# Patient Record
Sex: Female | Born: 1945
Health system: Southern US, Community
[De-identification: ages and names within clinical notes are randomized; demographics above are authoritative.]

## PROBLEM LIST (undated history)

## (undated) DIAGNOSIS — H04129 Dry eye syndrome of unspecified lacrimal gland: Secondary | ICD-10-CM

## (undated) DIAGNOSIS — M810 Age-related osteoporosis without current pathological fracture: Secondary | ICD-10-CM

## (undated) DIAGNOSIS — M1990 Unspecified osteoarthritis, unspecified site: Secondary | ICD-10-CM

## (undated) DIAGNOSIS — K5790 Diverticulosis of intestine, part unspecified, without perforation or abscess without bleeding: Secondary | ICD-10-CM

## (undated) DIAGNOSIS — J309 Allergic rhinitis, unspecified: Secondary | ICD-10-CM

## (undated) DIAGNOSIS — J324 Chronic pansinusitis: Secondary | ICD-10-CM

## (undated) DIAGNOSIS — G4730 Sleep apnea, unspecified: Secondary | ICD-10-CM

## (undated) HISTORY — DX: Sleep apnea, unspecified: G47.30

## (undated) HISTORY — DX: Diverticulosis of intestine, part unspecified, without perforation or abscess without bleeding: K57.90

## (undated) HISTORY — PX: WISDOM TOOTH EXTRACTION: SHX21

## (undated) HISTORY — DX: Allergic rhinitis, unspecified: J30.9

## (undated) HISTORY — PX: TONSILLECTOMY AND ADENOIDECTOMY: SUR1326

## (undated) HISTORY — DX: Age-related osteoporosis without current pathological fracture: M81.0

---

## 1983-10-21 HISTORY — PX: OTHER SURGICAL HISTORY: SHX169

## 1986-10-20 HISTORY — PX: TUBAL LIGATION: SHX77

## 2004-09-27 ENCOUNTER — Ambulatory Visit: Payer: Self-pay

## 2006-06-13 ENCOUNTER — Emergency Department: Payer: Self-pay | Admitting: Emergency Medicine

## 2006-06-13 ENCOUNTER — Other Ambulatory Visit: Payer: Self-pay

## 2011-02-13 HISTORY — PX: COLONOSCOPY: SHX174

## 2011-03-13 ENCOUNTER — Ambulatory Visit: Payer: Self-pay | Admitting: Gastroenterology

## 2012-07-05 ENCOUNTER — Ambulatory Visit: Payer: Self-pay | Admitting: Surgery

## 2012-07-05 HISTORY — PX: HERNIA REPAIR: SHX51

## 2012-07-24 ENCOUNTER — Emergency Department: Payer: Self-pay | Admitting: Emergency Medicine

## 2014-10-20 DIAGNOSIS — M810 Age-related osteoporosis without current pathological fracture: Secondary | ICD-10-CM

## 2014-10-20 HISTORY — DX: Age-related osteoporosis without current pathological fracture: M81.0

## 2015-02-06 NOTE — Op Note (Signed)
PATIENT NAME:  [name], Lynn L MR#:  [id number] DATE OF BIRTH:  October 18, 1946  DATE OF PROCEDURE:  07/05/2012  PREOPERATIVE DIAGNOSIS: Right inguinal hernia.   POSTOPERATIVE DIAGNOSIS: Right inguinal hernia.   PROCEDURE: Right inguinal hernia repair.   SURGEON: Adella HareJ. Wilton Burnsworth, MD  ANESTHESIA: General.   INDICATIONS: This 69 year old female has a history of right groin pain and physical findings of right inguinal hernia. Repair was recommended for definitive treatment.   DESCRIPTION OF PROCEDURE: The patient was placed on the operating table in the supine position under general anesthesia. The abdomen was clipped and prepared with ChloraPrep, draped in a sterile manner.   A transversely oriented right suprapubic incision was made some 4 cm in length, carried down through subcutaneous tissues. Several small bleeding points were cauterized. The Scarpa's fascia was incised. The external oblique aponeurosis was exposed. The external ring was identified. An incision was made in the external oblique aponeurosis along the course of its fibers to open the external ring and expose an indirect hernia sac which was dissected free from surrounding structures. It was approximately 4 cm in length and was separated from the ilioinguinal nerve. A high ligation of the sac was done with 4-0 Vicryl suture ligature. The sac was excised, did not need to be sent for pathology. The stump was allowed to retract. The repair was carried out with a row of 0 Surgilon sutures beginning at the pubic tubercle, suturing the conjoined tendon to the shelving edge of the inguinal ligament and the last stitch led to satisfactory closure of the internal ring. The ilioinguinal nerve was replaced along the floor of the inguinal canal. Cut edges of the external oblique aponeurosis were closed with running 4-0 Vicryl. The fascia superior and lateral to repair site was infiltrated with 10 mL of 0.5% Sensorcaine with epinephrine. Subcutaneous  tissues were infiltrated with 5 mL. Next Scarpa's fascia was closed with interrupted 4-0 Vicryl. The skin was closed with a running 4-0 Monocryl subcuticular suture and Dermabond.   The patient tolerated surgery satisfactorily and was then prepared for transfer to the recovery room.  ____________________________ Shela CommonsJ. Renda RollsWilton Jaspers, MD jws:cms D: 07/05/2012 08:28:07 ET T: 07/05/2012 08:50:59 ET JOB#: [id number]  cc: Adella HareJ. Wilton Wildrick, MD, <Dictator> Adella HareWILTON J Burnley MD ELECTRONICALLY SIGNED 07/08/2012 18:23

## 2017-06-23 ENCOUNTER — Emergency Department: Payer: Medicare Other

## 2017-06-23 ENCOUNTER — Emergency Department
Admission: EM | Admit: 2017-06-23 | Discharge: 2017-06-23 | Disposition: A | Discharge status: Home / Self Care | Payer: Medicare Other | Attending: Emergency Medicine | Admitting: Emergency Medicine

## 2017-06-23 ENCOUNTER — Encounter: Payer: Self-pay | Admitting: *Deleted

## 2017-06-23 DIAGNOSIS — Z7722 Contact with and (suspected) exposure to environmental tobacco smoke (acute) (chronic): Secondary | ICD-10-CM | POA: Diagnosis not present

## 2017-06-23 DIAGNOSIS — R55 Syncope and collapse: Secondary | ICD-10-CM | POA: Insufficient documentation

## 2017-06-23 DIAGNOSIS — X58XXXA Exposure to other specified factors, initial encounter: Secondary | ICD-10-CM | POA: Insufficient documentation

## 2017-06-23 DIAGNOSIS — Y929 Unspecified place or not applicable: Secondary | ICD-10-CM | POA: Insufficient documentation

## 2017-06-23 DIAGNOSIS — S0990XA Unspecified injury of head, initial encounter: Secondary | ICD-10-CM | POA: Insufficient documentation

## 2017-06-23 DIAGNOSIS — Y939 Activity, unspecified: Secondary | ICD-10-CM | POA: Insufficient documentation

## 2017-06-23 DIAGNOSIS — Y999 Unspecified external cause status: Secondary | ICD-10-CM | POA: Diagnosis not present

## 2017-06-23 HISTORY — DX: Dry eye syndrome of unspecified lacrimal gland: H04.129

## 2017-06-23 LAB — CBC WITH DIFFERENTIAL/PLATELET
BASOS ABS: 0 10*3/uL (ref 0–0.1)
BASOS PCT: 1 %
EOS ABS: 0.1 10*3/uL (ref 0–0.7)
EOS PCT: 1 %
HEMATOCRIT: 33.6 % — AB (ref 35.0–47.0)
Hemoglobin: 11.7 g/dL — ABNORMAL LOW (ref 12.0–16.0)
Lymphocytes Relative: 20 %
Lymphs Abs: 1.1 10*3/uL (ref 1.0–3.6)
MCH: 32.2 pg (ref 26.0–34.0)
MCHC: 34.7 g/dL (ref 32.0–36.0)
MCV: 92.7 fL (ref 80.0–100.0)
MONO ABS: 0.2 10*3/uL (ref 0.2–0.9)
MONOS PCT: 4 %
NEUTROS ABS: 4 10*3/uL (ref 1.4–6.5)
Neutrophils Relative %: 74 %
PLATELETS: 187 10*3/uL (ref 150–440)
RBC: 3.62 MIL/uL — ABNORMAL LOW (ref 3.80–5.20)
RDW: 14.8 % — AB (ref 11.5–14.5)
WBC: 5.4 10*3/uL (ref 3.6–11.0)

## 2017-06-23 LAB — BASIC METABOLIC PANEL
Anion gap: 7 (ref 5–15)
BUN: 20 mg/dL (ref 6–20)
CALCIUM: 9.3 mg/dL (ref 8.9–10.3)
CO2: 27 mmol/L (ref 22–32)
CREATININE: 0.71 mg/dL (ref 0.44–1.00)
Chloride: 107 mmol/L (ref 101–111)
Glucose, Bld: 138 mg/dL — ABNORMAL HIGH (ref 65–99)
Potassium: 3.5 mmol/L (ref 3.5–5.1)
Sodium: 141 mmol/L (ref 135–145)

## 2017-06-23 LAB — TROPONIN I: Troponin I: <0.03 ng/mL (ref <0.03)

## 2017-06-23 MED ORDER — SODIUM CHLORIDE 0.9 % IV BOLUS (SEPSIS)
1000.0000 mL | Freq: Once | INTRAVENOUS | Status: AC
  Administered 2017-06-23: 1000 mL via INTRAVENOUS

## 2017-06-23 NOTE — ED Provider Notes (Signed)
[facility] Emergency Department Provider Note  ____________________________________________   First MD Initiated Contact with Patient 06/23/17 2120     (approximate)  I have reviewed the triage vital signs and the nursing notes.   HISTORY  Chief Complaint Loss of Consciousness   HPI Lynn Gomez is a 71 y.o. female without any chronic medical conditions was presenting to the emergency department after an episode of syncope. Says that she was coming home from a meeting when she felt something bite her right index finger. Says it felt like a "yellow jacket." She says that she then went to the bathroom, urinated and then started to feel that her "ears were hot." She felt her heart beating quickly and then passed out. She hit her left eyebrow while falling. Only brief episode of unconsciousness without any witnessed convulsing, loss of bowel or bladder continence. Patient says that now she feels a pressure in her abdomen like she needs to move her bowels. Says that she has no documented allergy to bug bites or bee stings and that she has had mild swelling before with bites.She denies any difficulty breathing or feeling that her tongue swelling.   Past Medical History:  Diagnosis Date  . Dry eye syndrome     There are no active problems to display for this patient.   Past Surgical History:  Procedure Laterality Date  . HERNIA REPAIR Right   . TONSILLECTOMY    . TUBAL LIGATION      Prior to Admission medications   Not on File    Allergies Patient has no known allergies.  No family history on file.  Social History Social History  Substance Use Topics  . Smoking status: Passive Smoke Exposure - Never Smoker  . Smokeless tobacco: Never Used  . Alcohol use No    Review of Systems  Constitutional: No fever/chills Eyes: No visual changes. ENT: No sore throat. Cardiovascular: Denies chest pain. Respiratory: Denies shortness of  breath. Gastrointestinal: No abdominal pain.  No nausea, no vomiting.  No diarrhea.  No constipation. Genitourinary: Negative for dysuria. Musculoskeletal: Negative for back pain. Skin: Negative for rash. Neurological: Negative for headaches, focal weakness or numbness.   ____________________________________________   PHYSICAL EXAM:  VITAL SIGNS: ED Triage Vitals  Enc Vitals Group     BP      Pulse      Resp      Temp      Temp src      SpO2      Weight      Height      Head Circumference      Peak Flow      Pain Score      Pain Loc      Pain Edu?      Excl. in GC?     Constitutional: Alert and oriented. Well appearing and in no acute distress. Eyes: Conjunctivae are normal.  Head: Atraumatic. Nose: No congestion/rhinnorhea. Mouth/Throat: Mucous membranes are moist.  Neck: No stridor.   Cardiovascular: Normal rate, regular rhythm. Grossly normal heart sounds.  Good peripheral circulation. Respiratory: Normal respiratory effort.  No retractions. Lungs CTAB. Gastrointestinal: Soft and nontender. No distention. No CVA tenderness. Musculoskeletal: No lower extremity tenderness nor edema.  No joint effusions. Right DIP portion of her index finger with mild swelling without any erythema. No tenderness to palpation. Full range of motion with brisk capillary refill.  Neurologic:  Normal speech and language. No gross focal neurologic deficits are  appreciated. Skin:  Skin is warm, dry and intact. No rash noted. Psychiatric: Mood and affect are normal. Speech and behavior are normal.  ____________________________________________   LABS (all labs ordered are listed, but only abnormal results are displayed)  Labs Reviewed  CBC WITH DIFFERENTIAL/PLATELET - Abnormal; Notable for the following:       Result Value   RBC 3.62 (*)    Hemoglobin 11.7 (*)    HCT 33.6 (*)    RDW 14.8 (*)    All other components within normal limits  BASIC METABOLIC PANEL - Abnormal; Notable for  the following:    Glucose, Bld 138 (*)    All other components within normal limits  TROPONIN I  URINALYSIS, COMPLETE (UACMP) WITH MICROSCOPIC   ____________________________________________  EKG  ED ECG REPORT I, Arelia LongestSchaevitz,  Chattie Greeson M, the attending physician, personally viewed and interpreted this ECG.   Date: 06/23/2017  EKG Time: 2133  Rate: 70  Rhythm: normal sinus rhythm  Axis: Normal  Intervals:none  ST&T Change: No ST segment elevation or depression. No abnormal T-wave inversion.  ____________________________________________  RADIOLOGY  No acute finding on the CT of the head nor the chest x-ray. ____________________________________________   PROCEDURES  Procedure(s) performed:   Procedures  Critical Care performed:   ____________________________________________   INITIAL IMPRESSION / ASSESSMENT AND PLAN / ED COURSE  Pertinent labs & imaging results that were available during my care of the patient were reviewed by me and considered in my medical decision making (see chart for details).   ----------------------------------------- 11:09 PM on 06/23/2017 -----------------------------------------  Patient is asymptomatic at this time. To ambulate without any assistance without feelings of lightheadedness. Still denies any chest pain or any history of chest pain this evening. Painful stimulus with patient then urinating and then having the urge to move her bowels. Multiple reason for vasovagal episode. Very reassuring lab work. She is denying any dysuria or urinary frequency.  Unlikely to be UTI. Patient will be discharged home. Will follow-up with her primary care doctor.      ____________________________________________   FINAL CLINICAL IMPRESSION(S) / ED DIAGNOSES  Syncope. Head injury.    NEW MEDICATIONS STARTED DURING THIS VISIT:  New Prescriptions   No medications on file     Note:  This document was prepared using Dragon voice  recognition software and may include unintentional dictation errors.     Myrna BlazerSchaevitz, Honor Frison Matthew, MD 06/23/17 [phone]

## 2017-06-23 NOTE — ED Triage Notes (Signed)
Per EMS report, patient had a syncopal episode and hit left forehead. Patient has swelling, ecchymosis to left forehead. Patient reports a stinging sensation on right index finger when coming home from work. Patient states she believes she "passed out" twice.

## 2017-06-24 ENCOUNTER — Telehealth: Payer: Self-pay

## 2017-06-24 NOTE — Telephone Encounter (Signed)
Pt states she was seen in the ED for syncope and needs an appt in 1 day. Please call.

## 2017-06-24 NOTE — Telephone Encounter (Signed)
S/w patient. She has never seen a cardiologist and has never been seen in our office. ED discharge paperwork says for patient to schedule an appt with PCP, Dr Marcello FennelHande, which she has scheduled for next week and with Cardiology in 1 day. Next available new patient appt is October. Advised patient we could schedule her at next available and then after she sees Dr Marcello FennelHande, Dr Marcello FennelHande can determine if sooner f/u is necessary. Patient verbalized understanding. Transferred to scheduling and patient scheduled for 08/07/17.

## 2017-07-22 ENCOUNTER — Other Ambulatory Visit: Payer: Self-pay | Admitting: Internal Medicine

## 2017-07-22 DIAGNOSIS — Z1231 Encounter for screening mammogram for malignant neoplasm of breast: Secondary | ICD-10-CM

## 2017-08-07 ENCOUNTER — Ambulatory Visit: Payer: Medicare Other | Admitting: Cardiovascular Disease

## 2017-08-13 ENCOUNTER — Ambulatory Visit
Admission: RE | Admit: 2017-08-13 | Discharge: 2017-08-13 | Disposition: A | Discharge status: Home / Self Care | Payer: Medicare Other | Source: Ambulatory Visit | Attending: Internal Medicine | Admitting: Internal Medicine

## 2017-08-13 DIAGNOSIS — Z1231 Encounter for screening mammogram for malignant neoplasm of breast: Secondary | ICD-10-CM | POA: Insufficient documentation

## 2017-08-21 ENCOUNTER — Other Ambulatory Visit: Payer: Self-pay | Admitting: *Deleted

## 2017-08-21 ENCOUNTER — Inpatient Hospital Stay
Admission: RE | Admit: 2017-08-21 | Discharge: 2017-08-21 | Disposition: A | Discharge status: Home / Self Care | Payer: Self-pay | Source: Ambulatory Visit | Attending: *Deleted | Admitting: *Deleted

## 2017-08-21 DIAGNOSIS — Z9289 Personal history of other medical treatment: Secondary | ICD-10-CM

## 2017-09-21 ENCOUNTER — Ambulatory Visit (INDEPENDENT_AMBULATORY_CARE_PROVIDER_SITE_OTHER): Payer: Medicare Other | Admitting: Certified Nurse Midwife

## 2017-09-21 ENCOUNTER — Encounter: Payer: Self-pay | Admitting: Certified Nurse Midwife

## 2017-09-21 VITALS — BP 108/58 | HR 64 | Ht 65.0 in | Wt 139.0 lb

## 2017-09-21 DIAGNOSIS — G4730 Sleep apnea, unspecified: Secondary | ICD-10-CM | POA: Insufficient documentation

## 2017-09-21 DIAGNOSIS — Z01419 Encounter for gynecological examination (general) (routine) without abnormal findings: Secondary | ICD-10-CM

## 2017-09-21 DIAGNOSIS — H04123 Dry eye syndrome of bilateral lacrimal glands: Secondary | ICD-10-CM

## 2017-09-21 DIAGNOSIS — K5790 Diverticulosis of intestine, part unspecified, without perforation or abscess without bleeding: Secondary | ICD-10-CM | POA: Insufficient documentation

## 2017-09-21 DIAGNOSIS — H04129 Dry eye syndrome of unspecified lacrimal gland: Secondary | ICD-10-CM | POA: Insufficient documentation

## 2017-09-21 DIAGNOSIS — J309 Allergic rhinitis, unspecified: Secondary | ICD-10-CM | POA: Insufficient documentation

## 2017-09-21 DIAGNOSIS — Z124 Encounter for screening for malignant neoplasm of cervix: Secondary | ICD-10-CM | POA: Diagnosis not present

## 2017-09-21 NOTE — Progress Notes (Signed)
Gynecology Annual Exam  PCP: Barbette ReichmannHande, Vishwanath, MD  Chief Complaint:  Chief Complaint  Patient presents with  . Gynecologic Exam    History of Present Illness:Lynn Gomez is a 71 year old WF, G1 P0010 who presents today for her annual exam. She is having no significant GYN problems.   She has had no spotting.   The patient's past medical history is remarkable for osteoporosis of the spine; she declined biphosphonates but continues to exercise and take calcium and vitamin D3 supplements. She also has sleep apnea, dry eye syndrome, neurodermatitis,  and allergies.  [facility] Since her last annual GYN exam dated 08/30/28/2016, she has had no significant changes in her health. She reports being seen in the ER after having a vasovagal episode following a sting from a yellow jacket. She also reports that her husband stopped drinking. Has been abstinent x 7 months. She is not sexually active.   Her most recent pap smear was obtained 08/21/2014 and was normal.  Her most recent mammogram obtained on 08/24/2017 was normal and revealed no significant changes. There is a positive history of breast cancer in her maternal aunt. Genetic testing has not been done. There is no family history of ovarian cancer. The patient does do monthly self breast exams.  She had a colonoscopy in 2012 that was normal. Her next colonoscopy is due in 10 years.  She had a recent DEXA scan 10/26/2014 showing osteoporosis of the spine and osteopenia of the femur. The patient does not smoke.  The patient does not drink. The patient does not use illegal drugs.  The patient exercises regularly.  The patient does get adequate calcium in her diet and with her calcium supplement She had a recent cholesterol screen in 2018 that was normal.   The patient denies current symptoms of depression.    Review of Systems: Review of Systems  Constitutional: Negative for chills, fever and weight loss.  HENT:  Negative for congestion, sinus pain and sore throat.   Eyes: Negative for blurred vision and pain.  Respiratory: Negative for hemoptysis, shortness of breath and wheezing.   Cardiovascular: Negative for chest pain, palpitations and leg swelling.  Gastrointestinal: Negative for abdominal pain, blood in stool, diarrhea, heartburn, nausea and vomiting.  Genitourinary: Negative for dysuria, frequency, hematuria and urgency.  Musculoskeletal: Negative for back pain, joint pain and myalgias.  Skin: Negative for itching and rash.  Neurological: Negative for dizziness, tingling and headaches.  Endo/Heme/Allergies: Negative for environmental allergies and polydipsia. Does not bruise/bleed easily.       Negative for hirsutism   Psychiatric/Behavioral: Negative for depression. The patient is not nervous/anxious and does not have insomnia.     Past Medical History:  Past Medical History:  Diagnosis Date  . Allergic rhinitis   . Diverticulosis   . Dry eye syndrome   . Osteoporosis 2016   spine  . Sleep apnea     Past Surgical History:  Past Surgical History:  Procedure Laterality Date  . COLONOSCOPY  02/13/2011   normal  . HERNIA REPAIR Right 07/05/2012   [facility]  . submucous resection  1985  . TONSILLECTOMY AND ADENOIDECTOMY    . TUBAL LIGATION  1988   and D&C  . WISDOM TOOTH EXTRACTION      Family History:  Family History  Problem Relation Age of Onset  . Dementia Mother   . Heart failure Mother   . Aortic aneurysm Father   .  Brain cancer Sister   . Liver cancer Sister 42       with brain mets  . Sarcoidosis Sister   . Diabetes Brother   . Breast cancer Maternal Aunt 73  . Colon cancer Maternal Grandfather 40  . Diabetes Brother     Social History:  Social History   Socioeconomic History  . Marital status: Married    Spouse name: Not on file  . Number of children: 0  . Years of education: Not on file  . Highest education level: Not on file  Social  Needs  . Financial resource strain: Not on file  . Food insecurity - worry: Not on file  . Food insecurity - inability: Not on file  . Transportation needs - medical: Not on file  . Transportation needs - non-medical: Not on file  Occupational History  . Occupation: Airline pilot  Tobacco Use  . Smoking status: Passive Smoke Exposure - Never Smoker  . Smokeless tobacco: Never Used  Substance and Sexual Activity  . Alcohol use: No  . Drug use: No  . Sexual activity: Not Currently    Birth control/protection: Post-menopausal  Other Topics Concern  . Not on file  Social History Narrative  . Not on file    Allergies:  No Known Allergies  Medications: Current Outpatient Medications:  .  Ascorbic Acid (VITAMIN C) 1000 MG tablet, Take by mouth., Disp: , Rfl:  .  Calcium-Magnesium-Vitamin D (CALCIUM 500 PO), Take by mouth., Disp: , Rfl:  .  Cholecalciferol (VITAMIN D-1000 MAX ST) 1000 units tablet, Take by mouth., Disp: , Rfl:  .  Cod Liver Oil 5000-500 UNIT/5ML OIL, Take by mouth., Disp: , Rfl:  .  Collagen 500 MG CAPS, Use., Disp: , Rfl:  .  DHEA 25 MG CAPS, Take by mouth., Disp: , Rfl:  .  EPINEPHrine 0.3 mg/0.3 mL IJ SOAJ injection, Inject into the muscle., Disp: , Rfl:  .  Flaxseed, Linseed, (FLAXSEED OIL) OIL, Take by mouth., Disp: , Rfl:  .  fluocinonide ointment (LIDEX) 0.05 %, Apply topically., Disp: , Rfl:  .  Ginkgo Biloba 40 MG TABS, Take by mouth., Disp: , Rfl:  .  Milk Thistle 1000 MG CAPS, Take by mouth., Disp: , Rfl:  .  Misc Natural Products (TUMERSAID) TABS, Take by mouth., Disp: , Rfl:  .  Multiple Vitamin (MULTI-VITAMINS) TABS, Take by mouth., Disp: , Rfl:  .  RESTASIS 0.05 % ophthalmic emulsion, , Disp: , Rfl: 0 .  thiamine (GNP VITAMIN B-1) 100 MG tablet, Take by mouth., Disp: , Rfl:  .  triamcinolone (NASACORT) 55 MCG/ACT AERO nasal inhaler, Place into the nose., Disp: , Rfl:  .  triamcinolone cream (KENALOG) 0.1 %, Apply topically 2 (two) times daily., Disp: , Rfl:  0 .  vitamin E 400 UNIT capsule, Take by mouth., Disp: , Rfl:    Physical Exam Vitals: BP (!) 108/58   Pulse 64   Ht 5\' 5"  (1.651 m)   Wt 139 lb (63 kg)   BMI 23.13 kg/m   General: WF in NAD HEENT: normocephalic, anicteric Neck: no thyroid enlargement, no palpable nodules, no cervical lymphadenopathy  Pulmonary: No increased work of breathing, CTAB Cardiovascular: RRR, without murmur  Breast: Breast symmetrical, no tenderness, no palpable nodules or masses, no skin or nipple retraction present, no nipple discharge.  No axillary, infraclavicular or supraclavicular lymphadenopathy. Abdomen: Soft, non-tender, non-distended.  Umbilicus without lesions.  No hepatomegaly or masses palpable. No evidence of hernia. Genitourinary:  External:  Atrophic changes  Normal urethral meatus, normal Bartholin's and Skene's glands.    Vagina: Flattened rugae, no evidence of prolapse.    Cervix: Grossly normal in appearance, no bleeding, non-tender  Uterus: Midplane, small, mobile, and non-tender  Adnexa: No adnexal masses, non-tender  Rectal: deferred  Lymphatic: no evidence of inguinal lymphadenopathy Extremities: no edema, erythema, or tenderness Neurologic: Grossly intact Psychiatric: mood appropriate, affect full     Assessment: 71 y.o. annual gyn exam  Plan:   1) Breast cancer screening - recommend monthly self breast examand continued annual screening mammogram   2) COlon cancer screening: colonoscopy UTD, due 2022.  3) Cervical cancer screening - Pap was done. ASCCP guidelines and rational discussed.  Patient opts for every 3 years screening interval  4) Osteoporosis: Continue calcium and vitamin D3 supplementation and exercise program  5) Routine healthcare maintenance including cholesterol and diabetes screening managed by PCP   6) RTO in 2 years  Farrel Connersolleen Channelle Bottger, [place]

## 2017-09-23 LAB — PAP IG (IMAGE GUIDED): PAP Smear Comment: 0

## 2017-11-10 ENCOUNTER — Ambulatory Visit (INDEPENDENT_AMBULATORY_CARE_PROVIDER_SITE_OTHER): Payer: Medicare Other | Admitting: Certified Nurse Midwife

## 2017-11-10 ENCOUNTER — Encounter: Payer: Self-pay | Admitting: Certified Nurse Midwife

## 2017-11-10 VITALS — BP 110/60 | Ht 65.5 in | Wt 141.0 lb

## 2017-11-10 DIAGNOSIS — R87615 Unsatisfactory cytologic smear of cervix: Secondary | ICD-10-CM | POA: Diagnosis not present

## 2017-11-10 NOTE — Progress Notes (Signed)
  History of Present Illness:  Lynn Gomez is a 72 y.o. who had an unsatisfactory Pap smear 09/21/2017 due to insufficient cellularity. Lynn Gomez returns for a repeat Pap smea. Has been using vaginal estrogen as prescribed for the last 2 weeks. PMHx: Lynn Gomez  has a past medical history of Allergic rhinitis, Diverticulosis, Dry eye syndrome, Osteoporosis (2016), and Sleep apnea. Also,  has a past surgical history that includes Tubal ligation (1988); Hernia repair (Right, 07/05/2012); Colonoscopy (02/13/2011); Tonsillectomy and adenoidectomy; Wisdom tooth extraction; and submucous resection (1985)., family history includes Aortic aneurysm in her father; Brain cancer in her sister; Breast cancer (age of onset: 2773) in her maternal aunt; Colon cancer (age of onset: 940) in her maternal grandfather; Dementia in her mother; Diabetes in her brother and brother; Heart failure in her mother; Liver cancer (age of onset: 4055) in her sister; Sarcoidosis in her sister.,  reports that Lynn Gomez is a non-smoker but has been exposed to tobacco smoke. Lynn Gomez has never used smokeless tobacco. Lynn Gomez reports that Lynn Gomez does not drink alcohol or use drugs.  Lynn Gomez has a current medication list which includes the following prescription(s): vitamin c, calcium-magnesium-vitamin d, cholecalciferol, cod liver oil, collagen, dhea, epinephrine, flaxseed oil, fluocinonide ointment, ginkgo biloba, milk thistle, tumersaid, multi-vitamins, restasis, thiamine, triamcinolone, triamcinolone cream, and vitamin e. Also, has No Known Allergies.  ROS  Physical Exam:  BP 110/60   Ht 5' 5.5" (1.664 m)   Wt 141 lb (64 kg)   BMI 23.11 kg/m  Body mass index is 23.11 kg/m. Constitutional: Well nourished, well developed female in no acute distress.  Neuro: Grossly intact Psych:  Normal mood and affect.   Pelvic exam:  Vulva: atrophic changes, no inflammation or lesions  Vagina: moist, flattened rugae  Cervix: no lesions, friable cx os with Pap  Assessment:  Unsatisfactory Pap  Plan: Pap done If Pap normal return to office in 1 year and prn.   Farrel Connersolleen Billi Bright, CNM [facility], [facility] 11/10/2017  8:20 AM

## 2017-11-11 LAB — PAP IG (IMAGE GUIDED): PAP Smear Comment: 0

## 2018-01-20 ENCOUNTER — Other Ambulatory Visit: Payer: Self-pay | Admitting: Internal Medicine

## 2018-01-20 DIAGNOSIS — M810 Age-related osteoporosis without current pathological fracture: Secondary | ICD-10-CM

## 2018-01-20 DIAGNOSIS — M25561 Pain in right knee: Secondary | ICD-10-CM

## 2018-01-27 ENCOUNTER — Ambulatory Visit
Admission: RE | Admit: 2018-01-27 | Discharge: 2018-01-27 | Disposition: A | Discharge status: Home / Self Care | Payer: Medicare Other | Source: Ambulatory Visit | Attending: Internal Medicine | Admitting: Internal Medicine

## 2018-01-27 ENCOUNTER — Ambulatory Visit: Payer: Medicare Other

## 2018-01-27 DIAGNOSIS — M810 Age-related osteoporosis without current pathological fracture: Secondary | ICD-10-CM | POA: Insufficient documentation

## 2018-01-27 DIAGNOSIS — S83281A Other tear of lateral meniscus, current injury, right knee, initial encounter: Secondary | ICD-10-CM | POA: Diagnosis not present

## 2018-01-27 DIAGNOSIS — M25561 Pain in right knee: Secondary | ICD-10-CM | POA: Insufficient documentation

## 2018-01-27 DIAGNOSIS — X58XXXA Exposure to other specified factors, initial encounter: Secondary | ICD-10-CM | POA: Diagnosis not present

## 2018-01-27 DIAGNOSIS — M25461 Effusion, right knee: Secondary | ICD-10-CM | POA: Diagnosis not present

## 2018-10-18 ENCOUNTER — Other Ambulatory Visit: Payer: Self-pay | Admitting: Internal Medicine

## 2019-05-30 ENCOUNTER — Other Ambulatory Visit: Payer: Self-pay | Admitting: Internal Medicine

## 2019-05-30 DIAGNOSIS — Z1231 Encounter for screening mammogram for malignant neoplasm of breast: Secondary | ICD-10-CM

## 2019-06-02 ENCOUNTER — Other Ambulatory Visit: Payer: Self-pay | Admitting: Internal Medicine

## 2019-06-02 DIAGNOSIS — N644 Mastodynia: Secondary | ICD-10-CM

## 2019-06-15 ENCOUNTER — Ambulatory Visit
Admission: RE | Admit: 2019-06-15 | Discharge: 2019-06-15 | Disposition: A | Discharge status: Home / Self Care | Payer: Medicare Other | Source: Ambulatory Visit | Attending: Internal Medicine | Admitting: Internal Medicine

## 2019-06-15 DIAGNOSIS — N644 Mastodynia: Secondary | ICD-10-CM | POA: Insufficient documentation

## 2019-09-30 ENCOUNTER — Encounter: Payer: Self-pay | Admitting: Certified Nurse Midwife

## 2019-09-30 ENCOUNTER — Ambulatory Visit (INDEPENDENT_AMBULATORY_CARE_PROVIDER_SITE_OTHER): Payer: Medicare Other | Admitting: Certified Nurse Midwife

## 2019-09-30 ENCOUNTER — Other Ambulatory Visit: Payer: Self-pay

## 2019-09-30 VITALS — BP 90/60 | HR 66 | Ht 65.5 in | Wt 146.0 lb

## 2019-09-30 DIAGNOSIS — M816 Localized osteoporosis [Lequesne]: Secondary | ICD-10-CM

## 2019-09-30 DIAGNOSIS — Z780 Asymptomatic menopausal state: Secondary | ICD-10-CM

## 2019-09-30 DIAGNOSIS — Z01419 Encounter for gynecological examination (general) (routine) without abnormal findings: Secondary | ICD-10-CM

## 2019-09-30 NOTE — Progress Notes (Signed)
Gynecology Annual Exam  PCP: Barbette ReichmannHande, Vishwanath, MD  Chief Complaint:  Chief Complaint  Patient presents with  . Gynecologic Exam    Had left br pain in Sept, saw pcp, mammo showed nothing, was given antibx x2, decided it was a breast injury.    History of Present Illness:Lynn Gomez is a 73 year old WF, G1 P0010 who presents today for her annual exam. She is having no significant GYN problems.   She has had no spotting.   The patient's past medical history is remarkable for osteoporosis of the spine; she declined biphosphonates but continues to exercise and take calcium and vitamin D3 supplements. She also has sleep apnea, dry eye syndrome, neurodermatitis,  and allergies.  Her PCP is [facility]. Since her last annual GYN exam dated 09/21/2017, she has had no significant changes in her health. She was seen and evaluated for left breast pain and was treated for a left mastitis by her PCPin August. She also reports that her husband died 04/29/2018 after falling and breaking a hip. He was a recovering alcoholic.  She is not sexually active.  Her most recent pap smear was obtained 11/10/2017 and was normal. She had a unsatisfactory Pap smear 09/21/2017 and was treated with vaginal Premarin prior to repeating Pap smear 11/10/2017. No history of abnormal Pap smears Her most recent mammogram obtained on 06/15/2019 was normal and revealed no significant changes. There is a positive history of breast cancer in her maternal aunt. Genetic testing has not been done. There is no family history of ovarian cancer. The patient does do monthly self breast exams.  She had a colonoscopy in 2012 that was normal. Her next colonoscopy is due in 10 years (2022).  Her last DEXA scan 10/26/2014 showed osteoporosis of the spine and osteopenia of the femur. She takes calcium and vitamin D supplements and exercises with a PT twice a week.  The patient does not smoke.  The patient rarely drinks alcohol. The  patient does not use illegal drugs.  The patient exercises regularly.  The patient does get adequate calcium in her diet and with her calcium supplement She had a recent cholesterol screen in 2020 that was normal.   The patient denies current symptoms of depression.    Review of Systems: Review of Systems  Constitutional: Negative for chills, fever and weight loss.  HENT: Negative for congestion, sinus pain and sore throat.   Eyes: Negative for blurred vision and pain.  Respiratory: Negative for hemoptysis, shortness of breath and wheezing.   Cardiovascular: Negative for chest pain, palpitations and leg swelling.  Gastrointestinal: Negative for abdominal pain, blood in stool, diarrhea, heartburn, nausea and vomiting.  Genitourinary: Negative for dysuria, frequency, hematuria and urgency.  Musculoskeletal: Positive for joint pain. Negative for back pain and myalgias.  Skin: Negative for itching and rash.  Neurological: Negative for dizziness, tingling and headaches.  Endo/Heme/Allergies: Negative for environmental allergies and polydipsia. Does not bruise/bleed easily.       Negative for hirsutism. Positive for cold intolerance   Psychiatric/Behavioral: Negative for depression. The patient is not nervous/anxious and does not have insomnia.     Past Medical History:  Past Medical History:  Diagnosis Date  . Allergic rhinitis   . Diverticulosis   . Dry eye syndrome   . Osteoporosis 2016   spine  . Sleep apnea     Past Surgical History:  Past Surgical History:  Procedure Laterality Date  . COLONOSCOPY  02/13/2011   normal  . HERNIA REPAIR Right 07/05/2012   Dr Lavone [facility]  . submucous resection  1985  . TONSILLECTOMY AND ADENOIDECTOMY    . TUBAL LIGATION  1988   and D&C  . WISDOM TOOTH EXTRACTION      Family History:  Family History  Problem Relation Age of Onset  . Dementia Mother   . Heart failure Mother   . Aortic aneurysm Father   . Brain cancer Sister   .  Liver cancer Sister 38       with brain mets  . Sarcoidosis Sister   . Diabetes Brother   . Breast cancer Maternal Aunt 73  . Colon cancer Maternal Grandfather 67  . Diabetes Brother     Social History:  Social History   Socioeconomic History  . Marital status: Widowed    Spouse name: Not on file  . Number of children: 0  . Years of education: Not on file  . Highest education level: Not on file  Occupational History  . Occupation: Press photographer  Tobacco Use  . Smoking status: Never Smoker  . Smokeless tobacco: Never Used  Substance and Sexual Activity  . Alcohol use: Yes    Comment: rare  . Drug use: No  . Sexual activity: Not Currently    Birth control/protection: Post-menopausal  Other Topics Concern  . Not on file  Social History Narrative  . Not on file   Social Determinants of Health   Financial Resource Strain:   . Difficulty of Paying Living Expenses: Not on file  Food Insecurity:   . Worried About Charity fundraiser in the Last Year: Not on file  . Ran Out of Food in the Last Year: Not on file  Transportation Needs:   . Lack of Transportation (Medical): Not on file  . Lack of Transportation (Non-Medical): Not on file  Physical Activity:   . Days of Exercise per Week: Not on file  . Minutes of Exercise per Session: Not on file  Stress:   . Feeling of Stress : Not on file  Social Connections:   . Frequency of Communication with Friends and Family: Not on file  . Frequency of Social Gatherings with Friends and Family: Not on file  . Attends Religious Services: Not on file  . Active Member of Clubs or Organizations: Not on file  . Attends Archivist Meetings: Not on file  . Marital Status: Not on file  Intimate Partner Violence:   . Fear of Current or Ex-Partner: Not on file  . Emotionally Abused: Not on file  . Physically Abused: Not on file  . Sexually Abused: Not on file    Allergies:  No Known Allergies  Medications: Current Outpatient  Medications:  .  Ascorbic Acid (VITAMIN C) 1000 MG tablet, Take by mouth., Disp: , Rfl:  .  Calcium-Magnesium-Vitamin D (CALCIUM 500 PO), Take by mouth., Disp: , Rfl:  .  Cholecalciferol (VITAMIN D-1000 MAX ST) 1000 units tablet, Take by mouth., Disp: , Rfl:  .  Cod Liver Oil 5000-500 UNIT/5ML OIL, Take by mouth., Disp: , Rfl:  .  Collagen 500 MG CAPS, Use., Disp: , Rfl:  .  DHEA 25 MG CAPS, Take by mouth., Disp: , Rfl:  .  EPINEPHrine 0.3 mg/0.3 mL IJ SOAJ injection, Inject into the muscle., Disp: , Rfl:  .  Flaxseed, Linseed, (FLAXSEED OIL) OIL, Take by mouth., Disp: , Rfl:  .  fluocinonide ointment (LIDEX) 0.05 %, Apply topically.,  Disp: , Rfl:  .  Ginkgo Biloba 40 MG TABS, Take by mouth., Disp: , Rfl:  .  Milk Thistle 1000 MG CAPS, Take by mouth., Disp: , Rfl:  .  Misc Natural Products (TUMERSAID) TABS, Take by mouth., Disp: , Rfl:  .  Multiple Vitamin (MULTI-VITAMINS) TABS, Take by mouth., Disp: , Rfl:  .  OVER THE COUNTER MEDICATION, Take 5 mg by mouth daily., Disp: , Rfl:  .  RESTASIS 0.05 % ophthalmic emulsion, , Disp: , Rfl: 0 .  thiamine (GNP VITAMIN B-1) 100 MG tablet, Take by mouth., Disp: , Rfl:  .  triamcinolone (NASACORT) 55 MCG/ACT AERO nasal inhaler, Place into the nose., Disp: , Rfl:  .  vitamin E 400 UNIT capsule, Take by mouth., Disp: , Rfl:  .  FERREX 150 150 MG capsule, Take 150 mg by mouth daily., Disp: , Rfl: 0 .  triamcinolone cream (KENALOG) 0.1 %, Apply topically 2 (two) times daily., Disp: , Rfl: 0   Physical Exam Vitals: BP 90/60   Pulse 66   Ht 5' 5.5" (1.664 m)   Wt 146 lb (66.2 kg)   BMI 23.93 kg/m   General: WF in NAD HEENT: normocephalic, anicteric Neck: no thyroid enlargement, no palpable nodules, no cervical lymphadenopathy  Pulmonary: No increased work of breathing, CTAB Cardiovascular: RRR, without murmur  Breast: Breast symmetrical, no tenderness, no palpable nodules or masses, no skin or nipple retraction present, no nipple discharge.  No  axillary, infraclavicular or supraclavicular lymphadenopathy. Abdomen: Soft, non-tender, non-distended.  Umbilicus without lesions.  No hepatomegaly or masses palpable. No evidence of hernia. Genitourinary:  External: Atrophic changes  Normal urethral meatus, normal Bartholin's and Skene's glands.    Vagina: Flattened rugae, no evidence of prolapse.    Cervix: Grossly normal in appearance, no bleeding, non-tender  Uterus: Midplane, small, mobile, and non-tender  Adnexa: No adnexal masses, non-tender  Rectal: deferred  Lymphatic: no evidence of inguinal lymphadenopathy Extremities: no edema, erythema, or tenderness Neurologic: Grossly intact Psychiatric: mood appropriate, affect full     Assessment: 73 y.o. annual gyn exam  Plan:   1) Breast cancer screening - recommend monthly self breast examand continued annual screening mammogram   2) COlon cancer screening: colonoscopy UTD, due 2022.  3) Cervical cancer screening - Pap was not done. ASCCP guidelines and rational discussed.  Patient opts for every 3 years screening interval. Next due 2022  4) Osteoporosis: Continue calcium and vitamin D3 supplementation and exercise program. Schedule DEXA scan at [facility]  5) Routine healthcare maintenance including cholesterol and diabetes screening managed by PCP   6) RTO in 2 years and prn  Farrel Conners, CNM

## 2019-10-02 ENCOUNTER — Encounter: Payer: Self-pay | Admitting: Certified Nurse Midwife

## 2019-10-06 ENCOUNTER — Encounter: Payer: Self-pay | Admitting: Certified Nurse Midwife

## 2019-10-27 ENCOUNTER — Telehealth: Payer: Self-pay | Admitting: Certified Nurse Midwife

## 2019-10-27 DIAGNOSIS — Z780 Asymptomatic menopausal state: Secondary | ICD-10-CM

## 2019-10-27 NOTE — Telephone Encounter (Signed)
Called patient with results of the bone density scan she had done 10/06/2019. Her T score improved on her spine from -2.6 to -2.3 but her femoral hip T score worsened from -1.9 to -2.3. Frax scores were 21% and 7.5%. Getting in adequate amounts of calcium and vitamin D. Desires referral to endocrine to discuss options for treatment. Will refer to [facility] endocrinology, Dr Gershon Crane. Farrel Conners, CNM

## 2019-11-18 ENCOUNTER — Ambulatory Visit: Payer: Self-pay

## 2019-11-26 ENCOUNTER — Ambulatory Visit: Payer: Medicare PPO | Attending: Internal Medicine

## 2019-11-26 DIAGNOSIS — Z23 Encounter for immunization: Secondary | ICD-10-CM | POA: Insufficient documentation

## 2019-11-26 NOTE — Progress Notes (Signed)
   Covid-19 Vaccination Clinic  Name:  ZYONA PETTAWAY    MRN: [id number] DOB: June 20, 1946  11/26/2019  Ms. Spainhower was observed post Covid-19 immunization for 15 minutes without incidence. She was provided with Vaccine Information Sheet and instruction to access the V-Safe system.   Ms. Krabbenhoft was instructed to call 911 with any severe reactions post vaccine: Marland Kitchen Difficulty breathing  . Swelling of your face and throat  . A fast heartbeat  . A bad rash all over your body  . Dizziness and weakness    Immunizations Administered    Name Date Dose VIS Date Route   Pfizer COVID-19 Vaccine 11/26/2019  5:34 PM 0.3 mL 09/30/2019 Intramuscular   Manufacturer: [employer], [employer]   Lot: [id number]   NDC: [address]-5

## 2019-12-09 ENCOUNTER — Ambulatory Visit: Payer: Self-pay

## 2019-12-21 ENCOUNTER — Ambulatory Visit: Payer: Medicare PPO | Attending: Internal Medicine

## 2019-12-21 DIAGNOSIS — Z23 Encounter for immunization: Secondary | ICD-10-CM | POA: Insufficient documentation

## 2019-12-21 NOTE — Progress Notes (Signed)
   Covid-19 Vaccination Clinic  Name:  Lynn Gomez    MRN: [id number] DOB: [birth year]  12/21/2019  [name] was observed post Covid-19 immunization for 15 minutes without incident. She was provided with Vaccine Information Sheet and instruction to access the V-Safe system.   [name] was instructed to call 911 with any severe reactions post vaccine: Marland Kitchen Difficulty breathing  . Swelling of face and throat  . A fast heartbeat  . A bad rash all over body  . Dizziness and weakness   Immunizations Administered    Name Date Dose VIS Date Route   Pfizer COVID-19 Vaccine 12/21/2019  2:23 PM 0.3 mL 09/30/2019 Intramuscular   Manufacturer: [employer], [employer]   Lot: [id number]   NDC: [address]-6

## 2020-06-04 ENCOUNTER — Other Ambulatory Visit: Payer: Self-pay | Admitting: Internal Medicine

## 2020-06-04 DIAGNOSIS — Z1231 Encounter for screening mammogram for malignant neoplasm of breast: Secondary | ICD-10-CM

## 2020-06-20 ENCOUNTER — Ambulatory Visit
Admission: RE | Admit: 2020-06-20 | Discharge: 2020-06-20 | Disposition: A | Discharge status: Home / Self Care | Payer: Medicare PPO | Source: Ambulatory Visit | Attending: Internal Medicine | Admitting: Internal Medicine

## 2020-06-20 ENCOUNTER — Other Ambulatory Visit: Payer: Self-pay

## 2020-06-20 DIAGNOSIS — Z1231 Encounter for screening mammogram for malignant neoplasm of breast: Secondary | ICD-10-CM | POA: Diagnosis not present

## 2020-10-03 ENCOUNTER — Other Ambulatory Visit: Payer: Self-pay

## 2020-10-03 ENCOUNTER — Ambulatory Visit (INDEPENDENT_AMBULATORY_CARE_PROVIDER_SITE_OTHER): Payer: Medicare PPO | Admitting: Obstetrics

## 2020-10-03 ENCOUNTER — Encounter: Payer: Self-pay | Admitting: Obstetrics

## 2020-10-03 ENCOUNTER — Other Ambulatory Visit (HOSPITAL_COMMUNITY)
Admission: RE | Admit: 2020-10-03 | Discharge: 2020-10-03 | Disposition: A | Discharge status: Home / Self Care | Payer: Medicare PPO | Source: Ambulatory Visit | Attending: Obstetrics | Admitting: Obstetrics

## 2020-10-03 VITALS — BP 110/60 | HR 66 | Ht 65.5 in | Wt 141.0 lb

## 2020-10-03 DIAGNOSIS — Z01419 Encounter for gynecological examination (general) (routine) without abnormal findings: Secondary | ICD-10-CM | POA: Insufficient documentation

## 2020-10-03 DIAGNOSIS — Z124 Encounter for screening for malignant neoplasm of cervix: Secondary | ICD-10-CM | POA: Diagnosis not present

## 2020-10-03 DIAGNOSIS — Z780 Asymptomatic menopausal state: Secondary | ICD-10-CM | POA: Diagnosis not present

## 2020-10-04 NOTE — Progress Notes (Signed)
Routine Annual Gynecology Examination   PCP: Barbette Reichmann, MD  Chief Complaint:  Chief Complaint  Patient presents with  . Annual Exam    History of Present Illness: Patient is a 74 y.o. G1P0010 presents for annual exam. The patient has no complaints today. She does share that she is in a new relationship since her husband passed away last year.  Menopausal bleeding: denies  Menopausal symptoms: denies  Breast symptoms: denies  Last pap smear: 1 years ago.  Result Normal  Last mammogram:Just had this Summer in 2021.  Result Normal  Past Medical History:  Diagnosis Date  . Allergic rhinitis   . Diverticulosis   . Dry eye syndrome   . Osteoporosis 2016   spine  . Sleep apnea     Past Surgical History:  Procedure Laterality Date  . COLONOSCOPY  02/13/2011   normal  . HERNIA REPAIR Right 07/05/2012   Dr Murriel Hopper [facility]  . submucous resection  1985  . TONSILLECTOMY AND ADENOIDECTOMY    . TUBAL LIGATION  1988   and D&C  . WISDOM TOOTH EXTRACTION      Prior to Admission medications   Medication Sig Start Date End Date Taking? Authorizing Provider  Ascorbic Acid (VITAMIN C) 1000 MG tablet Take by mouth.   Yes [provider]  Calcium-Magnesium-Vitamin D (CALCIUM 500 PO) Take by mouth.   Yes [provider]  Cholecalciferol 25 MCG (1000 UT) tablet Take by mouth.   Yes [provider]  [facility] Liver Oil 5000-500 UNIT/5ML OIL Take by mouth.   Yes [provider]  Collagen 500 MG CAPS Use.   Yes [provider]  DHEA 10 MG TABS Take 5 mg by mouth daily.   Yes [provider]  EPINEPHrine 0.3 mg/0.3 mL IJ SOAJ injection Inject into the muscle.   Yes [provider]  FERREX 150 150 MG capsule Take 60 mg by mouth 3 (three) times a week. 10/05/17  Yes [provider]  Flaxseed, Linseed, (FLAXSEED OIL) OIL Take by mouth.   Yes [provider]  fluocinonide ointment (LIDEX) 0.05 % Apply  topically. 05/12/16  Yes [provider]  Ginkgo Biloba 40 MG TABS Take by mouth.   Yes [provider]  Milk Thistle 1000 MG CAPS Take by mouth.   Yes [provider]  Misc Natural Products (TUMERSAID) TABS Take by mouth.   Yes [provider]  Multiple Vitamin (MULTI-VITAMINS) TABS Take by mouth.   Yes [provider]  OVER THE COUNTER MEDICATION Take 1 tablet by mouth daily. Muscadine seed and pycnogenol   Yes [provider]  RESTASIS 0.05 % ophthalmic emulsion  06/23/17  Yes [provider]  thiamine 100 MG tablet Take by mouth.   Yes [provider]  triamcinolone (NASACORT) 55 MCG/ACT AERO nasal inhaler Place into the nose.   Yes [provider]  triamcinolone cream (KENALOG) 0.1 % Apply topically 2 (two) times daily. 08/07/17  Yes [provider]  vitamin E 400 UNIT capsule Take by mouth.   Yes [provider]    No Known Allergies  Gynecologic History:  No LMP recorded. Patient is postmenopausal. Contraception: post menopausal status Last Pap: 2020. Results were: normal Last mammogram: This Summer. Results were: normal  Obstetric History: G1P0010  Social History   Socioeconomic History  . Marital status: Widowed    Spouse name: Not on file  . Number of children: 0  . Years of education: Not on  file  . Highest education level: Not on file  Occupational History  . Occupation: Airline pilot  Tobacco Use  . Smoking status: Never Smoker  . Smokeless tobacco: Never Used  Vaping Use  . Vaping Use: Never used  Substance and Sexual Activity  . Alcohol use: Yes    Comment: rare  . Drug use: No  . Sexual activity: Not Currently    Birth control/protection: Post-menopausal  Other Topics Concern  . Not on file  Social History Narrative  . Not on file   Social Determinants of Health   Financial Resource Strain: Not on file  Food Insecurity: Not on file  Transportation Needs: Not on  file  Physical Activity: Not on file  Stress: Not on file  Social Connections: Not on file  Intimate Partner Violence: Not on file    Family History  Problem Relation Age of Onset  . Dementia Mother   . Heart failure Mother   . Aortic aneurysm Father   . Brain cancer Sister   . Liver cancer Sister 11       with brain mets  . Sarcoidosis Sister   . Diabetes Brother   . Breast cancer Maternal Aunt 73  . Colon cancer Maternal Grandfather 40  . Diabetes Brother     ROS   Physical Exam Vitals: BP 110/60   Pulse 66   Ht 5' 5.5" (1.664 m)   Wt 141 lb (64 kg)   BMI 23.11 kg/m   OBGyn Exam   Female chaperone present for pelvic and breast  portions of the physical exam  Results: AUDIT Questionnaire (screen for alcoholism): NA     Assessment and Plan:  74 y.o. G50P0010 female here for routine annual gynecologic examination  Plan: Problem List Items Addressed This Visit   None   Visit Diagnoses    Women's annual routine gynecological examination    -  Primary   Relevant Orders   Cytology - PAP   Cervical cancer screening       Relevant Orders   Cytology - PAP   Postmenopausal estrogen deficiency          Screening: -- Blood pressure screen normal -- Colonoscopy - due - managed by PCP -- Mammogram - not due -- Weight screening: normal -- Depression screening negative (PHQ-9) -- Nutrition: normal -- cholesterol screening: per PCP -- osteoporosis screening: not due -- tobacco screening: not using -- alcohol screening: AUDIT questionnaire indicates low-risk usage. -- family history of breast cancer screening: done. not at high risk. -- no evidence of domestic violence or intimate partner violence. -- STD screening: gonorrhea/chlamydia NAAT not collected per patient request. -- pap smear collected per ASCCP guidelines -- flu vaccine per her PCP -- HPV vaccination series: not eligilbe   Mirna Mires, CNM  10/04/2020 6:22 PM   10/04/2020 6:18 PM

## 2020-10-05 ENCOUNTER — Encounter: Payer: Self-pay | Admitting: Obstetrics

## 2020-10-05 LAB — CYTOLOGY - PAP
Comment: NEGATIVE
Diagnosis: NEGATIVE
High risk HPV: NEGATIVE

## 2021-05-22 ENCOUNTER — Other Ambulatory Visit: Payer: Self-pay | Admitting: Internal Medicine

## 2021-05-22 DIAGNOSIS — Z1231 Encounter for screening mammogram for malignant neoplasm of breast: Secondary | ICD-10-CM

## 2021-06-21 ENCOUNTER — Other Ambulatory Visit: Payer: Self-pay

## 2021-06-21 ENCOUNTER — Ambulatory Visit
Admission: RE | Admit: 2021-06-21 | Discharge: 2021-06-21 | Disposition: A | Discharge status: Home / Self Care | Payer: Medicare PPO | Source: Ambulatory Visit | Attending: Internal Medicine | Admitting: Internal Medicine

## 2021-06-21 DIAGNOSIS — Z1231 Encounter for screening mammogram for malignant neoplasm of breast: Secondary | ICD-10-CM | POA: Diagnosis not present

## 2021-10-07 ENCOUNTER — Encounter: Payer: Self-pay | Admitting: Obstetrics

## 2021-10-07 ENCOUNTER — Other Ambulatory Visit: Payer: Self-pay

## 2021-10-07 ENCOUNTER — Ambulatory Visit (INDEPENDENT_AMBULATORY_CARE_PROVIDER_SITE_OTHER): Payer: Medicare PPO | Admitting: Obstetrics

## 2021-10-07 VITALS — BP 118/68 | HR 82 | Ht 65.5 in | Wt 141.0 lb

## 2021-10-07 DIAGNOSIS — Z01419 Encounter for gynecological examination (general) (routine) without abnormal findings: Secondary | ICD-10-CM | POA: Diagnosis not present

## 2021-10-07 NOTE — Progress Notes (Signed)
Gynecology Annual Exam  PCP: Barbette Reichmann, MD  Chief Complaint:  Chief Complaint  Patient presents with   Annual Exam    History of Present Illness:Patient is a 75 y.o. G1P0010 presents for annual exam. The patient has no complaints today. She is single, not sexually active. Works out twice a week with a Psychologist, educational. Volunteers a lot, active at [employer].  LMP: No LMP recorded. Patient is postmenopausal.  The patient is not sexually active. She denies dyspareunia.  The patient does perform self breast exams.  There is no notable family history of breast or ovarian cancer in her family.  The patient wears seatbelts: yes.   The patient has regular exercise: yes.    The patient denies current symptoms of depression.     Review of Systems: Review of Systems  Constitutional: Negative.   HENT: Negative.    Eyes: Negative.   Respiratory: Negative.    Cardiovascular: Negative.   Gastrointestinal: Negative.   Genitourinary: Negative.   Skin: Negative.   Neurological: Negative.   Endo/Heme/Allergies: Negative.   Psychiatric/Behavioral: Negative.     Past Medical History:  Patient Active Problem List   Diagnosis Date Noted   Dry eye syndrome    Diverticulosis    Allergic rhinitis    Sleep apnea    Osteoporosis 10/20/2014    spine     Past Surgical History:  Past Surgical History:  Procedure Laterality Date   COLONOSCOPY  02/13/2011   normal   HERNIA REPAIR Right 07/05/2012   Dr Murriel Hopper [facility]   submucous resection  1985   TONSILLECTOMY AND ADENOIDECTOMY     TUBAL LIGATION  1988   and D&C   WISDOM TOOTH EXTRACTION      Gynecologic History:  No LMP recorded. Patient is postmenopausal. Last Pap: Results were: NILM no abnormalities 09/2021 Last mammogram: 2022 Results were: BI-RAD I  Obstetric History: G1P0010  Family History:  Family History  Problem Relation Age of Onset   Dementia Mother    Heart failure Mother    Aortic aneurysm Father    Breast  cancer Sister 20       metastatic   Brain cancer Sister    Liver cancer Sister 15       with brain mets   Sarcoidosis Sister    Diabetes Brother    Diabetes Brother    Breast cancer Maternal Aunt 39   Colon cancer Maternal Grandfather 74    Social History:  Social History   Socioeconomic History   Marital status: Widowed    Spouse name: Not on file   Number of children: 0   Years of education: Not on file   Highest education level: Not on file  Occupational History   Occupation: sales  Tobacco Use   Smoking status: Never   Smokeless tobacco: Never  Vaping Use   Vaping Use: Never used  Substance and Sexual Activity   Alcohol use: Yes    Comment: rare   Drug use: No   Sexual activity: Not Currently    Birth control/protection: Post-menopausal  Other Topics Concern   Not on file  Social History Narrative   Not on file   Social Determinants of Health   Financial Resource Strain: Not on file  Food Insecurity: Not on file  Transportation Needs: Not on file  Physical Activity: Not on file  Stress: Not on file  Social Connections: Not on file  Intimate Partner Violence: Not on file    Allergies:  No Known Allergies  Medications: Prior to Admission medications   Medication Sig Start Date End Date Taking? Authorizing Provider  Ascorbic Acid (VITAMIN C) 1000 MG tablet Take by mouth.   Yes [provider]  Calcium-Magnesium-Vitamin D (CALCIUM 500 PO) Take by mouth.   Yes [provider]  Cholecalciferol 25 MCG (1000 UT) tablet Take by mouth.   Yes [provider]  [facility] Liver Oil 5000-500 UNIT/5ML OIL Take by mouth.   Yes [provider]  Collagen 500 MG CAPS Use.   Yes [provider]  DHEA 10 MG TABS Take 5 mg by mouth daily.   Yes [provider]  EPINEPHrine 0.3 mg/0.3 mL IJ SOAJ injection Inject into the muscle.   Yes [provider]  FERREX 150 150 MG capsule Take 60 mg by mouth 3 (three) times a  week. 10/05/17  Yes [provider]  Flaxseed, Linseed, (FLAXSEED OIL) OIL Take by mouth.   Yes [provider]  Ginkgo Biloba 40 MG TABS Take by mouth.   Yes [provider]  Milk Thistle 1000 MG CAPS Take by mouth.   Yes [provider]  Misc Natural Products (TUMERSAID) TABS Take by mouth.   Yes [provider]  Multiple Vitamin (MULTI-VITAMINS) TABS Take by mouth.   Yes [provider]  OVER THE COUNTER MEDICATION Take 1 tablet by mouth daily. Muscadine seed and pycnogenol   Yes [provider]  RESTASIS 0.05 % ophthalmic emulsion  06/23/17  Yes [provider]  thiamine 100 MG tablet Take by mouth.   Yes [provider]  triamcinolone (NASACORT) 55 MCG/ACT AERO nasal inhaler Place into the nose.   Yes [provider]  vitamin E 400 UNIT capsule Take by mouth.   Yes [provider]    Physical Exam Vitals: Blood pressure 118/68, pulse 82, height 5' 5.5" (1.664 m), weight 141 lb (64 kg).  General: NAD HEENT: normocephalic, anicteric Thyroid: no enlargement, no palpable nodules Pulmonary: No increased work of breathing, CTAB Cardiovascular: RRR, distal pulses 2+ Breast: Breast symmetrical, no tenderness, no palpable nodules or masses, no skin or nipple retraction present, no nipple discharge.  No axillary or supraclavicular lymphadenopathy. Abdomen: NABS, soft, non-tender, non-distended.  Umbilicus without lesions.  No hepatomegaly, splenomegaly or masses palpable. No evidence of hernia  Genitourinary:  External: Normal external female genitalia.  Normal urethral meatus, normal Bartholin's and Skene's glands.    Vagina: Normal vaginal mucosa, no evidence of prolapse.    Cervix: Grossly normal in appearance, no bleeding  Uterus: Non-enlarged, mobile, normal contour.  No CMT  Adnexa: ovaries non-enlarged, no adnexal masses  Rectal: deferred  Lymphatic: no evidence of inguinal  lymphadenopathy Extremities: no edema, erythema, or tenderness Neurologic: Grossly intact Psychiatric: mood appropriate, affect full  Female chaperone present for pelvic and breast  portions of the physical exam     Assessment: 75 y.o. G1P0010 routine annual exam  Plan: Problem List Items Addressed This Visit   None Visit Diagnoses     Women's annual routine gynecological examination    -  Primary       1) Mammogram - recommend yearly screening mammogram.  Mammogram Is up to date  2) STI screening  was notoffered and therefore not obtained  3) ASCCP guidelines and rational discussed.  Patient opts for discontinue age >39 screening interval  4) Osteoporosis  - per USPTF routine screening DEXA at age 59 Per PCP  Consider FDA-approved medical therapies in postmenopausal women  and men aged 48 years and older, based on the following: a) A hip or vertebral (clinical or morphometric) fracture b) T-score ? -2.5 at the femoral neck or spine after appropriate evaluation to exclude secondary causes C) Low bone mass (T-score between -1.0 and -2.5 at the femoral neck or spine) and a 10-year probability of a hip fracture ? 3% or a 10-year probability of a major osteoporosis-related fracture ? 20% based on the [place]-adapted WHO algorithm   5) Routine healthcare maintenance including cholesterol, diabetes screening discussed managed by PCP  6) Colonoscopy up to date.  Screening recommended starting at age 66 for average risk individuals, age 13 for individuals deemed at increased risk (including African Americans) and recommended to continue until age 53.  For patient age 28-85 individualized approach is recommended.  Gold standard screening is via colonoscopy, Cologuard screening is an acceptable alternative for patient unwilling or unable to undergo colonoscopy.  "Colorectal cancer screening for average?risk adults: 2018 guideline update from the American Cancer Society"[place]: A Cancer Journal for  Clinicians: Mar 18, 2017   7) Return in about 1 year (around 10/07/2022) for annual.    Mirna Mires, CNM  10/07/2021 9:36 AM   Domingo Pulse, [facility] 10/07/2021, 9:36 AM

## 2022-05-14 IMAGING — MG DIGITAL SCREENING BILAT W/ TOMO W/ CAD
6 of 10 series · 6 of 30 positions shown · non-contrast
Comparison: Previous exam(s).

CLINICAL DATA: Screening.

EXAM:
DIGITAL SCREENING BILATERAL MAMMOGRAM WITH TOMO AND CAD

[R CC synth-2D]
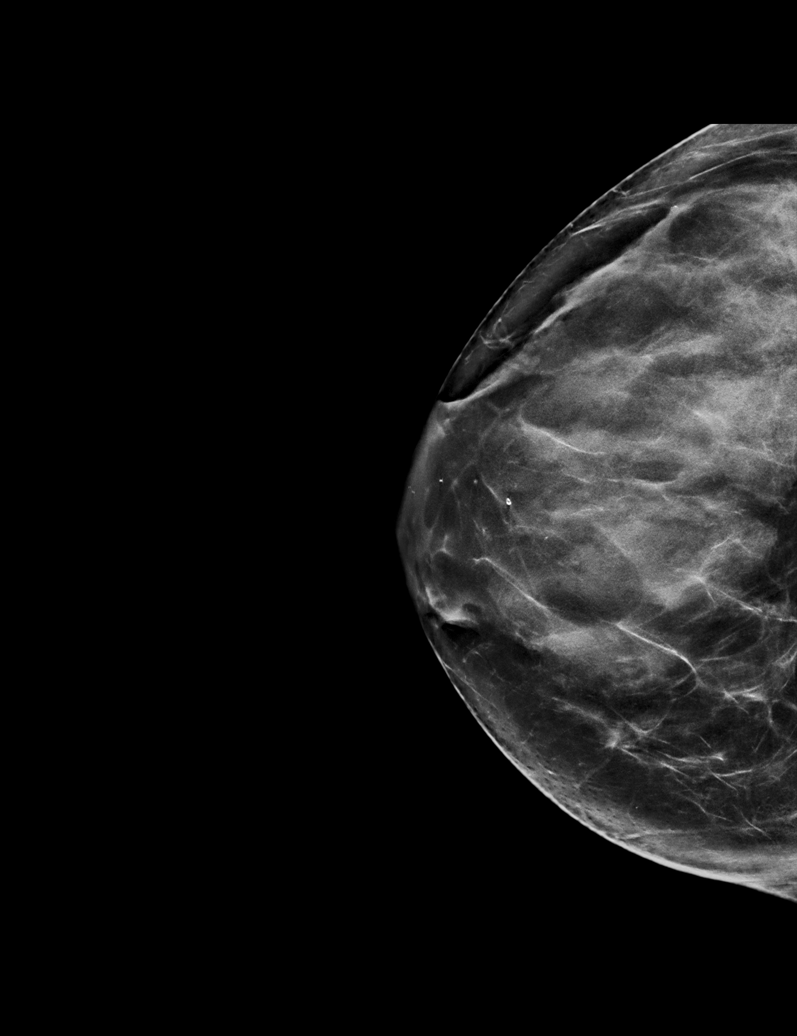

[L MLO synth-2D]
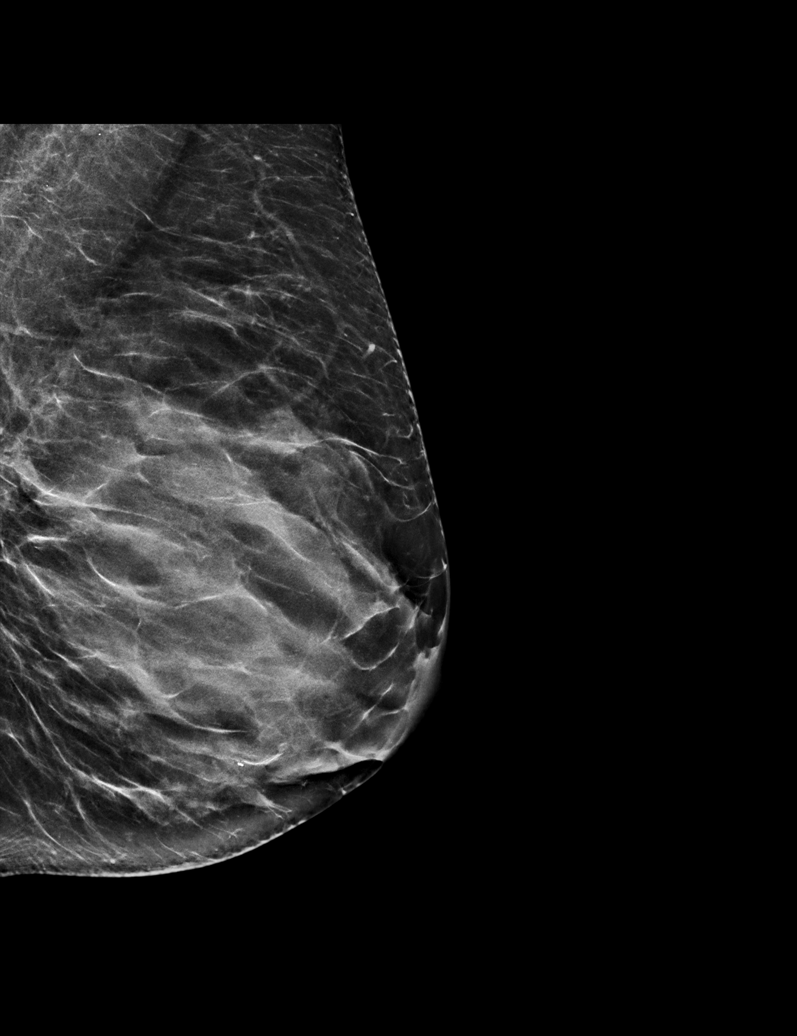

[R MLO synth-2D (1 of 2)]
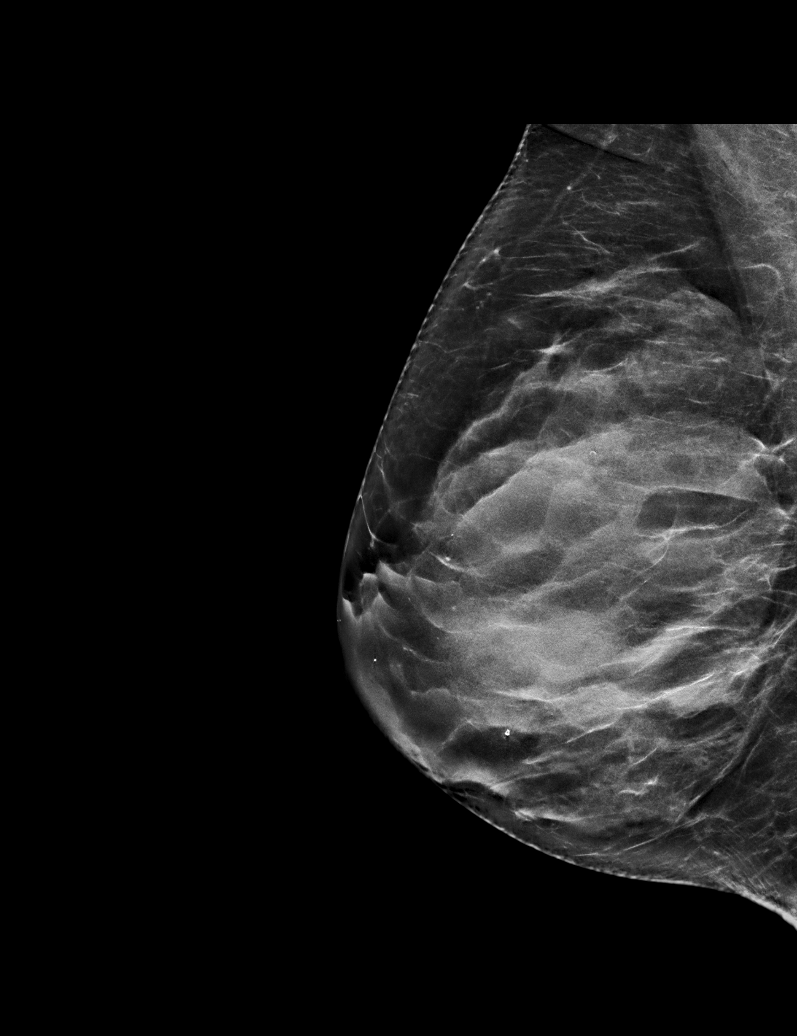

[R MLO synth-2D (2 of 2)]
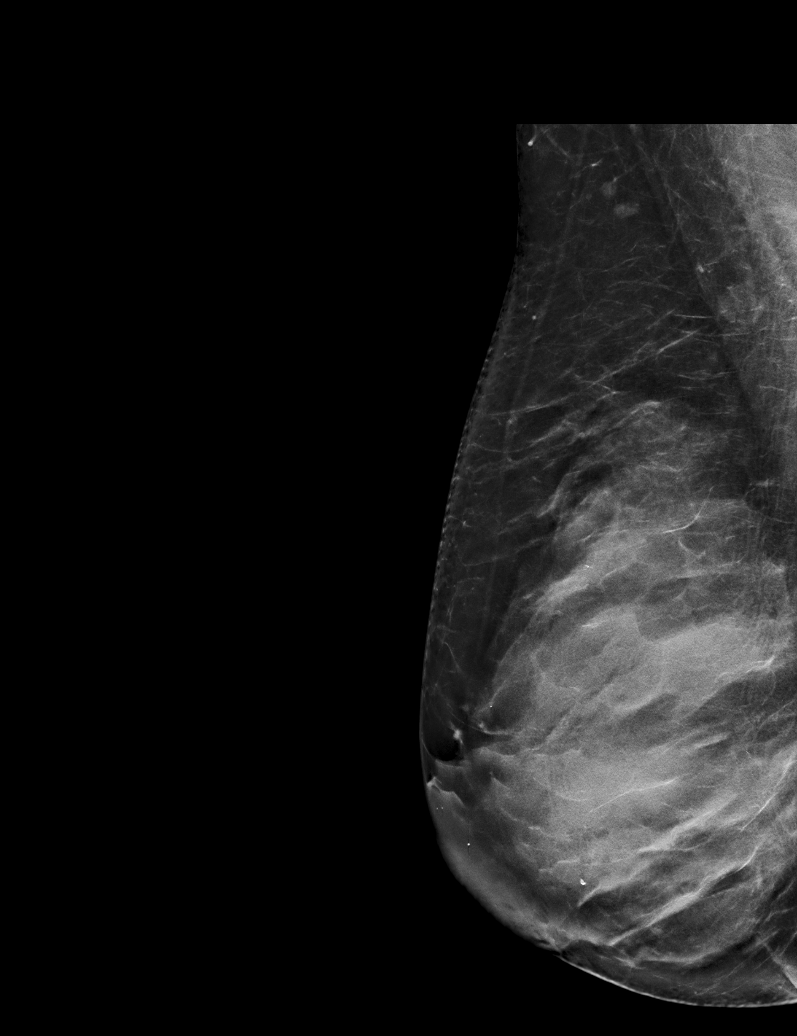

[L CC synth-2D]
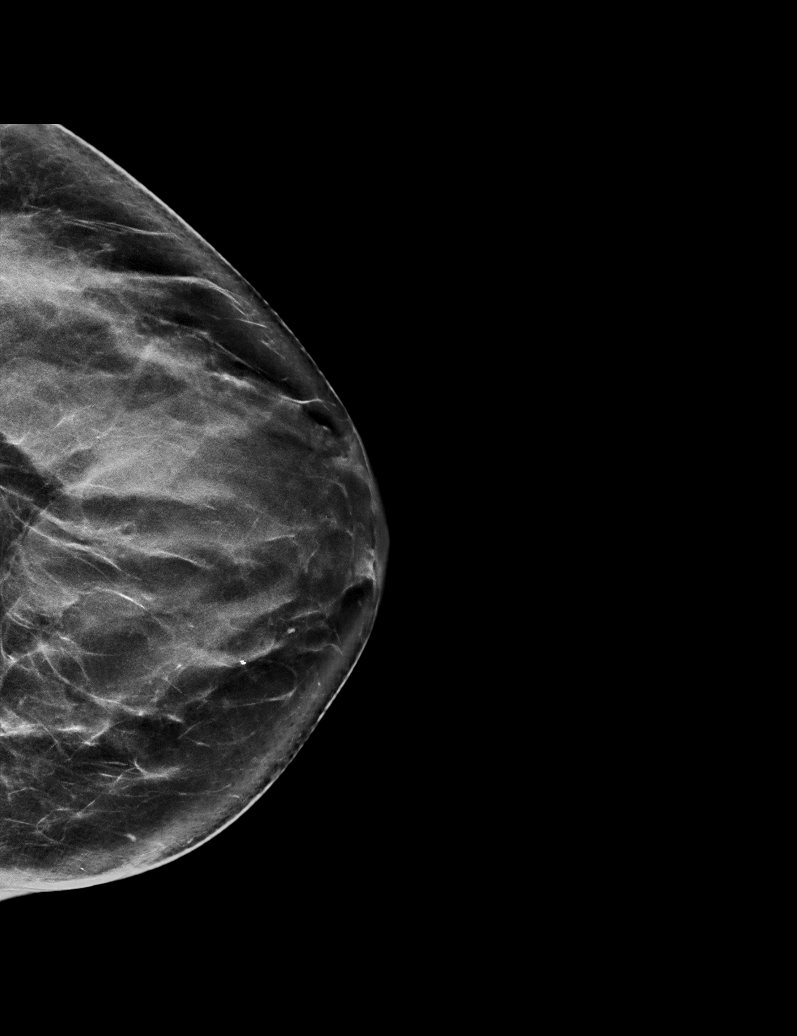

[R CC tomo · tomo slice 35/70.0]
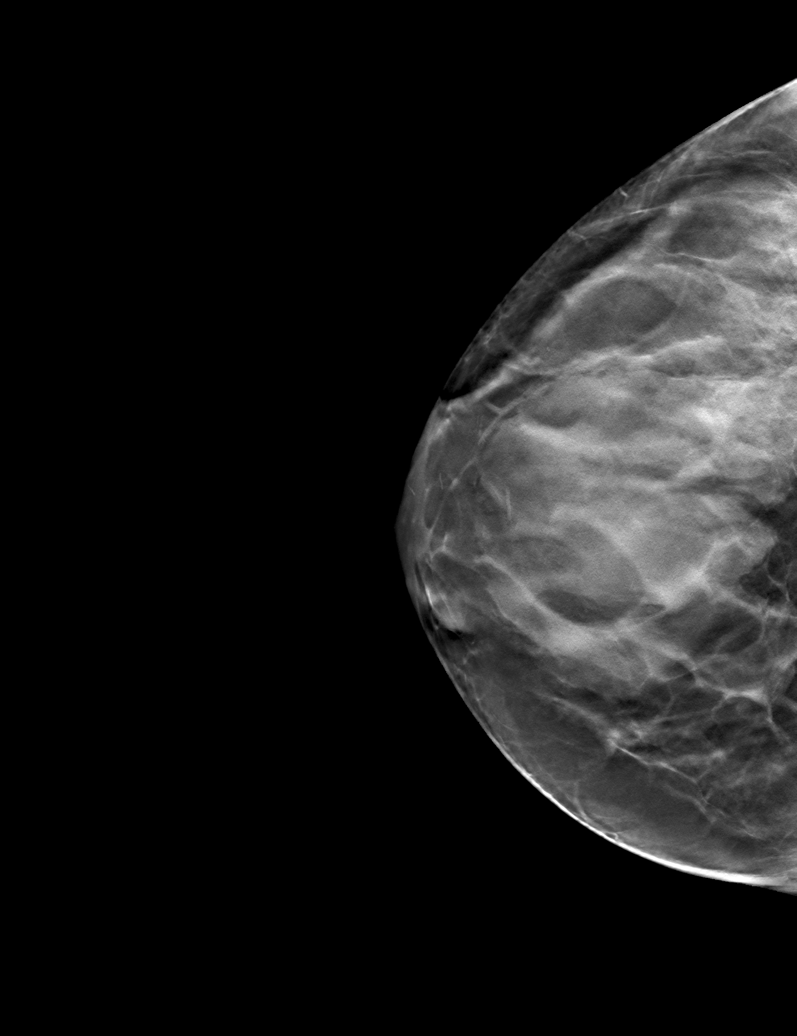

[6 of 30 positions shown; findings below may reference images not displayed]

ACR Breast Density Category d: The breast tissue is extremely dense,
which lowers the sensitivity of mammography
FINDINGS: There are no findings suspicious for malignancy. Images were
processed with CAD.
IMPRESSION: No mammographic evidence of malignancy. A result letter of this
screening mammogram will be mailed directly to the patient.

RECOMMENDATION:
Screening mammogram in one year. (Code:WO-0-ZI0)

BI-RADS CATEGORY  1: Negative.

## 2022-05-15 LAB — COLOGUARD: COLOGUARD: NEGATIVE

## 2022-05-28 ENCOUNTER — Other Ambulatory Visit: Payer: Self-pay | Admitting: Internal Medicine

## 2022-05-28 DIAGNOSIS — Z1231 Encounter for screening mammogram for malignant neoplasm of breast: Secondary | ICD-10-CM

## 2022-06-25 ENCOUNTER — Ambulatory Visit
Admission: RE | Admit: 2022-06-25 | Discharge: 2022-06-25 | Disposition: A | Discharge status: Home / Self Care | Payer: Medicare PPO | Source: Ambulatory Visit | Attending: Internal Medicine | Admitting: Internal Medicine

## 2022-06-25 DIAGNOSIS — Z1231 Encounter for screening mammogram for malignant neoplasm of breast: Secondary | ICD-10-CM | POA: Diagnosis not present

## 2022-08-04 ENCOUNTER — Other Ambulatory Visit: Payer: Self-pay

## 2022-08-04 ENCOUNTER — Emergency Department: Payer: Medicare PPO

## 2022-08-04 DIAGNOSIS — S0093XA Contusion of unspecified part of head, initial encounter: Secondary | ICD-10-CM | POA: Insufficient documentation

## 2022-08-04 DIAGNOSIS — T63461A Toxic effect of venom of wasps, accidental (unintentional), initial encounter: Secondary | ICD-10-CM | POA: Insufficient documentation

## 2022-08-04 DIAGNOSIS — R109 Unspecified abdominal pain: Secondary | ICD-10-CM | POA: Insufficient documentation

## 2022-08-04 DIAGNOSIS — R55 Syncope and collapse: Secondary | ICD-10-CM | POA: Insufficient documentation

## 2022-08-04 DIAGNOSIS — S0990XA Unspecified injury of head, initial encounter: Secondary | ICD-10-CM | POA: Diagnosis present

## 2022-08-04 DIAGNOSIS — X58XXXA Exposure to other specified factors, initial encounter: Secondary | ICD-10-CM | POA: Insufficient documentation

## 2022-08-04 LAB — URINALYSIS, ROUTINE W REFLEX MICROSCOPIC
Bilirubin Urine: NEGATIVE
Glucose, UA: NEGATIVE mg/dL
Hgb urine dipstick: NEGATIVE
Ketones, ur: NEGATIVE mg/dL
Leukocytes,Ua: NEGATIVE
Nitrite: NEGATIVE
Protein, ur: NEGATIVE mg/dL
Specific Gravity, Urine: 1.01 (ref 1.005–1.030)
pH: 5 (ref 5.0–8.0)

## 2022-08-04 LAB — CBC
HCT: 42.3 % (ref 36.0–46.0)
Hemoglobin: 13.9 g/dL (ref 12.0–15.0)
MCH: 31.2 pg (ref 26.0–34.0)
MCHC: 32.9 g/dL (ref 30.0–36.0)
MCV: 94.8 fL (ref 80.0–100.0)
Platelets: 242 10*3/uL (ref 150–400)
RBC: 4.46 MIL/uL (ref 3.87–5.11)
RDW: 13.8 % (ref 11.5–15.5)
WBC: 6 10*3/uL (ref 4.0–10.5)
nRBC: 0 % (ref 0.0–0.2)

## 2022-08-04 LAB — BASIC METABOLIC PANEL
Anion gap: 8 (ref 5–15)
BUN: 20 mg/dL (ref 8–23)
CO2: 30 mmol/L (ref 22–32)
Calcium: 9.9 mg/dL (ref 8.9–10.3)
Chloride: 102 mmol/L (ref 98–111)
Creatinine, Ser: 0.68 mg/dL (ref 0.44–1.00)
GFR, Estimated: >60 mL/min (ref >60)
Glucose, Bld: 111 mg/dL — ABNORMAL HIGH (ref 70–99)
Potassium: 4.4 mmol/L (ref 3.5–5.1)
Sodium: 140 mmol/L (ref 135–145)

## 2022-08-04 LAB — TROPONIN I (HIGH SENSITIVITY)
Troponin I (High Sensitivity): 10 ng/L (ref <18)
Troponin I (High Sensitivity): 10 ng/L (ref <18)

## 2022-08-04 NOTE — ED Triage Notes (Signed)
Pt arrives with c/o a syncopal episode after getting stung by a yellow jacket. 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 Pt endorses ABD pain, stool incontinence, and dizziness after sting.

## 2022-08-04 NOTE — ED Provider Triage Note (Signed)
  Emergency Medicine Provider Triage Evaluation Note  Lynn Gomez , a 76 y.o.female,  was evaluated in triage.  Pt complains of syncope.  Patient states that about 6 days ago, she was stung by a yellow jacket.  Within the next few minutes, she went to the bathroom reportedly passed out and fell onto the ground.  She is unsure if she hit her head.  She states that she has had allergic reaction to yellowjacket in the past and relates that she may be having one now.  She states that she is concerned about possible anaphylaxis.   Review of Systems  Positive: Syncope. Negative: Denies fever, chest pain, vomiting  Physical Exam   Vitals:   08/04/22 1907 08/04/22 2129  BP: (!) 126/105 125/66  Pulse: 75 65  Resp: 18 17  Temp: 98.8 F (37.1 C) 97.9 F (36.6 C)  SpO2: 100% 99%   Gen:   Awake, no distress   Resp:  Normal effort  MSK:   Moves extremities without difficulty  Other:    Medical Decision Making  Given the patient's initial medical screening exam, the following diagnostic evaluation has been ordered. The patient will be placed in the appropriate treatment space, once one is available, to complete the evaluation and treatment. I have discussed the plan of care with the patient and I have advised the patient that an ED physician or mid-level practitioner will reevaluate their condition after the test results have been received, as the results may give them additional insight into the type of treatment they may need.    Diagnostics: Labs, EKG, head CT, cervical spine CT  Treatments: none immediately   Teodoro Spray, [place] 08/04/22 2233

## 2022-08-05 ENCOUNTER — Emergency Department
Admission: EM | Admit: 2022-08-05 | Discharge: 2022-08-05 | Disposition: A | Discharge status: Home / Self Care | Payer: Medicare PPO | Attending: Emergency Medicine | Admitting: Emergency Medicine

## 2022-08-05 DIAGNOSIS — R55 Syncope and collapse: Secondary | ICD-10-CM

## 2022-08-05 DIAGNOSIS — S0990XA Unspecified injury of head, initial encounter: Secondary | ICD-10-CM

## 2022-08-05 NOTE — ED Provider Notes (Signed)
[facility] Provider Note    Event Date/Time   First MD Initiated Contact with Patient 08/05/22 0122     (approximate)   History   Loss of Consciousness   HPI  Lynn Gomez is a 76 y.o. female with history of previous syncopal events after insect stings who presents to the emergency department with an insect sting to the hand with subsequent syncopal event that occurred 6 days ago.  She states she thinks she was stung by a yellow jacket.  She denies having hives, lip or tongue swelling, difficulty breathing but states she felt lightheaded and passed out with standing up from the couch.  She did hit her head.  States she did have some abdominal cramping and incontinence with the episode but none since.  She is not on blood thinners.  She states since that time she has felt tired and intermittently dizzy but no chest pain, shortness of breath, near syncope.  No other vomiting or diarrhea.  States she has had a very similar episode a few years ago after an insect sting but states that more recently she is also been bitten by fire ants and stung by a hornet before and did not have the symptoms.  She is not sure if her symptoms are due to anaphylaxis or vasovagal syncope.  She denies using an EpiPen at home but does have one that is expired and just got a new prescription from her PCP.  She did put Benadryl cream on her hand but did not take any oral medications.  She denies any infectious symptoms such as fevers, cough, dysuria or hematuria.   History provided by patient.    Past Medical History:  Diagnosis Date   Allergic rhinitis    Diverticulosis    Dry eye syndrome    Osteoporosis 2016   spine   Sleep apnea     Past Surgical History:  Procedure Laterality Date   COLONOSCOPY  02/13/2011   normal   HERNIA REPAIR Right 07/05/2012   Dr Lavone [facility]   submucous resection  [place]   and D&C    WISDOM TOOTH EXTRACTION      MEDICATIONS:  Prior to Admission medications   Medication Sig Start Date End Date Taking? Authorizing Provider  Ascorbic Acid (VITAMIN C) 1000 MG tablet Take by mouth.    [provider]  Calcium-Magnesium-Vitamin D (CALCIUM 500 PO) Take by mouth.    [provider]  Cholecalciferol 25 MCG (1000 UT) tablet Take by mouth.    [provider]  [facility] Liver Oil 5000-500 UNIT/5ML OIL Take by mouth.    [provider]  Collagen 500 MG CAPS Use.    [provider]  DHEA 10 MG TABS Take 5 mg by mouth daily.    [provider]  EPINEPHrine 0.3 mg/0.3 mL IJ SOAJ injection Inject into the muscle.    [provider]  FERREX 150 150 MG capsule Take 60 mg by mouth 3 (three) times a week. 10/05/17   [provider]  Flaxseed, Linseed, (FLAXSEED OIL) OIL Take by mouth.    [provider]  Ginkgo Biloba 40 MG TABS Take by mouth.    [provider]  Milk Thistle 1000 MG CAPS Take by mouth.    [provider]  Misc Natural Products (TUMERSAID) TABS Take by mouth.    [provider]  Multiple Vitamin (  MULTI-VITAMINS) TABS Take by mouth.    [provider]  OVER THE COUNTER MEDICATION Take 1 tablet by mouth daily. Muscadine seed and pycnogenol    [provider]  RESTASIS 0.05 % ophthalmic emulsion  06/23/17   [provider]  thiamine 100 MG tablet Take by mouth.    [provider]  triamcinolone (NASACORT) 55 MCG/ACT AERO nasal inhaler Place into the nose.    [provider]  vitamin E 400 UNIT capsule Take by mouth.    [provider]    Physical Exam   Triage Vital Signs: ED Triage Vitals [08/04/22 1907]  Enc Vitals Group     BP (!) 126/105     Pulse Rate 75     Resp 18     Temp 98.8 F (37.1 C)     Temp Source Oral     SpO2 100 %     Weight 141 lb (64 kg)     Height 5\' 5"  (1.651 m)     Head Circumference       Peak Flow      Pain Score 0     Pain Loc      Pain Edu?      Excl. in [place]?     Most recent vital signs: Vitals:   08/04/22 2129 08/05/22 0207  BP: 125/66   Pulse: 65 66  Resp: 17 20  Temp: 97.9 F (36.6 C) 97.9 F (36.6 C)  SpO2: 99% 99%    CONSTITUTIONAL: Alert and oriented and responds appropriately to questions. Well-appearing; well-nourished HEAD: Normocephalic, yellow appearing bruising noted to the left temple EYES: Conjunctivae clear, pupils appear equal, sclera nonicteric ENT: normal nose; moist mucous membranes NECK: Supple, normal ROM CARD: RRR; S1 and S2 appreciated; no murmurs, no clicks, no rubs, no gallops RESP: Normal chest excursion without splinting or tachypnea; breath sounds clear and equal bilaterally; no wheezes, no rhonchi, no rales, no hypoxia or respiratory distress, speaking full sentences ABD/GI: Normal bowel sounds; non-distended; soft, non-tender, no rebound, no guarding, no peritoneal signs BACK: The back appears normal EXT: Normal ROM in all joints; no deformity noted, no edema; no cyanosis, no calf tenderness or calf swelling SKIN: Normal color for age and race; warm; no rash on exposed skin NEURO: Moves all extremities equally, normal speech, normal gait, normal sensation, no facial asymmetry PSYCH: The patient's mood and manner are appropriate.   ED Results / Procedures / Treatments   LABS: (all labs ordered are listed, but only abnormal results are displayed) Labs Reviewed  BASIC METABOLIC PANEL - Abnormal; Notable for the following components:      Result Value   Glucose, Bld 111 (*)    All other components within normal limits  URINALYSIS, ROUTINE W REFLEX MICROSCOPIC - Abnormal; Notable for the following components:   Color, Urine YELLOW (*)    APPearance HAZY (*)    All other components within normal limits  CBC  TROPONIN I (HIGH SENSITIVITY)  TROPONIN I (HIGH SENSITIVITY)     EKG:  EKG Interpretation  Date/Time:  Monday  August 04 2022 19:04:18 EDT Ventricular Rate:  75 PR Interval:  140 QRS Duration: 86 QT Interval:  374 QTC Calculation: 417 R Axis:   75 Text Interpretation: Normal sinus rhythm Possible Anterior infarct , age undetermined Abnormal ECG When compared with ECG of 23-Jun-2017 21:33, PREVIOUS ECG IS PRESENT Confirmed by Pryor Curia [id number]) on 08/05/2022 1:43:32 AM         RADIOLOGY: My  personal review and interpretation of imaging: CT head and cervical spine show no traumatic injury.  I have personally reviewed all radiology reports.   CT Head Wo Contrast  Result Date: 08/04/2022 CLINICAL DATA:  Head and neck trauma. Syncope episode after getting stung by yellow jacket. 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 EXAM: CT HEAD WITHOUT CONTRAST CT CERVICAL SPINE WITHOUT CONTRAST TECHNIQUE: Multidetector CT imaging of the head and cervical spine was performed following the standard protocol without intravenous contrast. Multiplanar CT image reconstructions of the cervical spine were also generated. RADIATION DOSE REDUCTION: This exam was performed according to the departmental dose-optimization program which includes automated exposure control, adjustment of the mA and/or kV according to patient size and/or use of iterative reconstruction technique. COMPARISON:  None Available. FINDINGS: CT HEAD FINDINGS Brain: No evidence of acute infarction, hemorrhage, hydrocephalus, extra-axial collection or mass lesion/mass effect. Vascular: No hyperdense vessel or unexpected calcification. Skull: Normal. Negative for fracture or focal lesion. Sinuses/Orbits: No acute finding. Other: None. CT CERVICAL SPINE FINDINGS Alignment: Straightening of the cervical spine. Mild anterolisthesis of C4 and C7. Mild retrolisthesis of C5. Skull base and vertebrae: No acute fracture. No primary bone lesion or focal pathologic process. Soft tissues and spinal canal: No prevertebral fluid or swelling. No visible canal  hematoma. Disc levels: C2-C3:  No significant findings C3-C4: Moderate left facet joint arthropathy. No significant spinal canal or neural foraminal stenosis. C4-C5: Mild right and moderate left facet joint arthropathy. No significant spinal canal or neural foraminal stenosis. C5-C6: Disc osteophyte complex with mild-to-moderate spinal canal stenosis. Mild left and moderate right neural foraminal stenosis. Uncovertebral joint arthropathy. C6-C7: Disc osteophyte complex and uncovertebral joint arthropathy with mild left and moderate right neural foraminal stenosis. Mild spinal canal stenosis. C7-T1:  No significant finding. Upper chest: Negative. Other: None IMPRESSION: CT HEAD: No acute intracranial abnormality. CT CERVICAL SPINE: 1. No acute fracture or traumatic subluxation. 2. Multilevel degenerative disc disease and facet joint arthropathy with mild-to-moderate spinal canal stenosis at C5-C6 and mild spinal canal stenosis at C6-C7. Moderate right neural foraminal stenosis at C5-C6 and C6-C7. Electronically Signed   By: Keane Police D.O.   On: 08/04/2022 23:08   CT Cervical Spine Wo Contrast  Result Date: 08/04/2022 CLINICAL DATA:  Head and neck trauma. Syncope episode after getting stung by yellow jacket. 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 EXAM: CT HEAD WITHOUT CONTRAST CT CERVICAL SPINE WITHOUT CONTRAST TECHNIQUE: Multidetector CT imaging of the head and cervical spine was performed following the standard protocol without intravenous contrast. Multiplanar CT image reconstructions of the cervical spine were also generated. RADIATION DOSE REDUCTION: This exam was performed according to the departmental dose-optimization program which includes automated exposure control, adjustment of the mA and/or kV according to patient size and/or use of iterative reconstruction technique. COMPARISON:  None Available. FINDINGS: CT HEAD FINDINGS Brain: No evidence of acute infarction, hemorrhage,  hydrocephalus, extra-axial collection or mass lesion/mass effect. Vascular: No hyperdense vessel or unexpected calcification. Skull: Normal. Negative for fracture or focal lesion. Sinuses/Orbits: No acute finding. Other: None. CT CERVICAL SPINE FINDINGS Alignment: Straightening of the cervical spine. Mild anterolisthesis of C4 and C7. Mild retrolisthesis of C5. Skull base and vertebrae: No acute fracture. No primary bone lesion or focal pathologic process. Soft tissues and spinal canal: No prevertebral fluid or swelling. No visible canal hematoma. Disc levels: C2-C3:  No significant findings C3-C4: Moderate left facet joint arthropathy. No significant spinal canal or neural foraminal stenosis. C4-C5: Mild right  and moderate left facet joint arthropathy. No significant spinal canal or neural foraminal stenosis. C5-C6: Disc osteophyte complex with mild-to-moderate spinal canal stenosis. Mild left and moderate right neural foraminal stenosis. Uncovertebral joint arthropathy. C6-C7: Disc osteophyte complex and uncovertebral joint arthropathy with mild left and moderate right neural foraminal stenosis. Mild spinal canal stenosis. C7-T1:  No significant finding. Upper chest: Negative. Other: None IMPRESSION: CT HEAD: No acute intracranial abnormality. CT CERVICAL SPINE: 1. No acute fracture or traumatic subluxation. 2. Multilevel degenerative disc disease and facet joint arthropathy with mild-to-moderate spinal canal stenosis at C5-C6 and mild spinal canal stenosis at C6-C7. Moderate right neural foraminal stenosis at C5-C6 and C6-C7. Electronically Signed   By: Keane Police D.O.   On: 08/04/2022 23:08     PROCEDURES:  Critical Care performed: No     Procedures    IMPRESSION / MDM / ASSESSMENT AND PLAN / ED COURSE  I reviewed the triage vital signs and the nursing notes.    Patient here with a syncopal event that occurred 6 days ago after she was stung in the hand by a yellow jacket.  Did hit her head  and has some bruising to the left temple.  Currently states she is just feeling tired.  Has had some intermittent lightheadedness.  The patient is on the cardiac monitor to evaluate for evidence of arrhythmia and/or significant heart rate changes.   DIFFERENTIAL DIAGNOSIS (includes but not limited to):   Anaphylactic reaction, vasovagal, doubt ACS, PE, dissection.  Will check for symptomatic anemia, electrolyte derangement, UTI.  Symptoms may also be postconcussive but intracranial hemorrhage, skull fracture also in the differential.   Patient's presentation is most consistent with acute presentation with potential threat to life or bodily function.   PLAN: Work-up initiated in triage.  Labs show normal hemoglobin, no leukocytosis, normal glucose and electrolytes.  Troponin x2 negative.  Urine shows no ketones to suggest dehydration and no infection.  CT head and cervical spine reviewed/interpreted by myself and the radiologist and show no traumatic injury.  Patient has no clinical signs of allergic reaction at this time.  There is no urticaria, angioedema, hypotension, wheezing.  I am not convinced that this is an anaphylactic reaction especially given she did not take any oral medications at home and did not administer IM epinephrine and symptoms improved.  Discussed that this could be vasovagal in nature especially given she has had fire ant bites and a hornet sting in between these episodes of syncope with yellowjacket stings that have caused almost no symptoms.  We discussed that some of her symptoms of feeling tired and dizzy could be from her head injury and could be related to a mild concussion but there is no intracranial hemorrhage, skull fracture or stroke.  She denies having infectious symptoms.  Have offered COVID swab but she declines.  Low suspicion that this is cardiac in nature especially given episode happened 6 days ago and she is not having chest pain, shortness of breath.  EKG shows  no arrhythmia or ischemia.  I feel she is safe to be discharged home and follow-up with her primary care doctor.  We discussed risk and benefits of steroids but I am not convinced again that this is all from an allergic reaction and I do not think that it would be beneficial.  She agrees with holding off on steroids at this time.  I feel she is safe for discharge home.  She is also comfortable with this plan.  MEDICATIONS GIVEN IN ED: Medications - No data to display   ED COURSE:  At this time, I do not feel there is any life-threatening condition present. I reviewed all nursing notes, vitals, pertinent previous records.  All lab and urine results, EKGs, imaging ordered have been independently reviewed and interpreted by myself.  I reviewed all available radiology reports from any imaging ordered this visit.  Based on my assessment, I feel the patient is safe to be discharged home without further emergent workup and can continue workup as an outpatient as needed. Discussed all findings, treatment plan as well as usual and customary return precautions.  They verbalize understanding and are comfortable with this plan.  Outpatient follow-up has been provided as needed.  All questions have been answered.  She states she already has another EpiPen that she has picked up from the pharmacy.  Discussed with her when and how to use this medication.  CONSULTS: Admission considered but work-up is reassuring and symptoms are improving and syncopal episode happened 6 days ago after an insect sting.  She has had similar presentation before.   OUTSIDE RECORDS REVIEWED: Reviewed patient's last office visit with her PCP on 04/28/2022.       FINAL CLINICAL IMPRESSION(S) / ED DIAGNOSES   Final diagnoses:  Syncope and collapse  Injury of head, initial encounter     Rx / DC Orders   ED Discharge Orders     None        Note:  This document was prepared using Dragon voice recognition software and may  include unintentional dictation errors.   Wyonia Fontanella, Delice Bison, DO 08/05/22 0400

## 2023-05-27 ENCOUNTER — Other Ambulatory Visit: Payer: Self-pay | Admitting: Internal Medicine

## 2023-05-27 DIAGNOSIS — Z1231 Encounter for screening mammogram for malignant neoplasm of breast: Secondary | ICD-10-CM

## 2023-06-29 ENCOUNTER — Ambulatory Visit
Admission: RE | Admit: 2023-06-29 | Discharge: 2023-06-29 | Disposition: A | Discharge status: Home / Self Care | Payer: Medicare PPO | Source: Ambulatory Visit | Attending: Internal Medicine | Admitting: Internal Medicine

## 2023-06-29 DIAGNOSIS — Z1231 Encounter for screening mammogram for malignant neoplasm of breast: Secondary | ICD-10-CM | POA: Diagnosis present

## 2024-02-09 ENCOUNTER — Other Ambulatory Visit: Payer: Self-pay | Admitting: Otolaryngology

## 2024-02-09 DIAGNOSIS — J324 Chronic pansinusitis: Secondary | ICD-10-CM

## 2024-02-16 ENCOUNTER — Encounter: Payer: Self-pay | Admitting: Otolaryngology

## 2024-02-19 ENCOUNTER — Ambulatory Visit
Admission: RE | Admit: 2024-02-19 | Discharge: 2024-02-19 | Disposition: A | Discharge status: Home / Self Care | Source: Ambulatory Visit | Attending: Otolaryngology | Admitting: Otolaryngology

## 2024-02-19 DIAGNOSIS — J324 Chronic pansinusitis: Secondary | ICD-10-CM

## 2024-03-25 ENCOUNTER — Other Ambulatory Visit: Payer: Self-pay | Admitting: Otolaryngology

## 2024-05-23 ENCOUNTER — Encounter: Payer: Self-pay | Admitting: Otolaryngology

## 2024-05-23 NOTE — Anesthesia Preprocedure Evaluation (Addendum)
 Anesthesia Evaluation  Patient identified by MRN, date of birth, ID band Patient awake    Reviewed: Allergy & Precautions, H&P , NPO status , Patient's Chart, lab work & pertinent test results  Airway Mallampati: III  TM Distance: <3 FB Neck ROM: Full    Dental no notable dental hx.    Pulmonary sleep apnea , former smoker   Pulmonary exam normal breath sounds clear to auscultation       Cardiovascular negative cardio ROS Normal cardiovascular exam Rhythm:Regular Rate:Normal     Neuro/Psych negative neurological ROS  negative psych ROS   GI/Hepatic negative GI ROS, Neg liver ROS,,,  Endo/Other  negative endocrine ROS    Renal/GU negative Renal ROS  negative genitourinary   Musculoskeletal negative musculoskeletal ROS (+) Arthritis ,    Abdominal   Peds negative pediatric ROS (+)  Hematology negative hematology ROS (+)   Anesthesia Other Findings  Dry eye syndrome  Allergic rhinitis Diverticulosis  Osteoporosis Sleep apnea  Arthritis Syncope from bee sting  Reproductive/Obstetrics negative OB ROS                              Anesthesia Physical Anesthesia Plan  ASA: 3  Anesthesia Plan:    Post-op Pain Management:    Induction: Intravenous  PONV Risk Score and Plan:   Airway Management Planned: LMA  Additional Equipment:   Intra-op Plan:   Post-operative Plan: Extubation in OR  Informed Consent: I have reviewed the patients History and Physical, chart, labs and discussed the procedure including the risks, benefits and alternatives for the proposed anesthesia with the patient or authorized representative who has indicated his/her understanding and acceptance.     Dental Advisory Given  Plan Discussed with: Anesthesiologist, CRNA and Surgeon  Anesthesia Plan Comments: (Patient consented for risks of anesthesia including but not limited to:  - adverse reactions to  medications - damage to eyes, teeth, lips or other oral mucosa - nerve damage due to positioning  - sore throat or hoarseness - Damage to heart, brain, nerves, lungs, other parts of body or loss of life  Patient voiced understanding and assent.)         Anesthesia Quick Evaluation

## 2024-05-26 NOTE — Discharge Instructions (Signed)

## 2024-06-02 ENCOUNTER — Encounter
Admission: RE | Disposition: A | Discharge status: Home / Self Care | Payer: Self-pay | Source: Home / Self Care | Attending: Otolaryngology

## 2024-06-02 ENCOUNTER — Ambulatory Visit: Payer: Self-pay | Admitting: Anesthesiology

## 2024-06-02 ENCOUNTER — Ambulatory Visit
Admission: RE | Admit: 2024-06-02 | Discharge: 2024-06-02 | Disposition: A | Discharge status: Home / Self Care | Attending: Otolaryngology | Admitting: Otolaryngology

## 2024-06-02 ENCOUNTER — Encounter: Payer: Self-pay | Admitting: Otolaryngology

## 2024-06-02 ENCOUNTER — Other Ambulatory Visit: Payer: Self-pay

## 2024-06-02 DIAGNOSIS — Z87891 Personal history of nicotine dependence: Secondary | ICD-10-CM | POA: Insufficient documentation

## 2024-06-02 DIAGNOSIS — G4730 Sleep apnea, unspecified: Secondary | ICD-10-CM | POA: Insufficient documentation

## 2024-06-02 DIAGNOSIS — J320 Chronic maxillary sinusitis: Secondary | ICD-10-CM | POA: Diagnosis present

## 2024-06-02 HISTORY — DX: Unspecified osteoarthritis, unspecified site: M19.90

## 2024-06-02 HISTORY — PX: MAXILLARY ANTROSTOMY: SHX2003

## 2024-06-02 HISTORY — PX: IMAGE GUIDED SINUS SURGERY: SHX6570

## 2024-06-02 HISTORY — DX: Chronic pansinusitis: J32.4

## 2024-06-02 SURGERY — SINUS SURGERY, WITH IMAGING GUIDANCE
Anesthesia: General | Laterality: Right

## 2024-06-02 MED ORDER — SUCCINYLCHOLINE CHLORIDE 200 MG/10ML IV SOSY
PREFILLED_SYRINGE | INTRAVENOUS | Status: AC
  Filled 2024-06-02: qty 10

## 2024-06-02 MED ORDER — ONDANSETRON HCL 4 MG/2ML IJ SOLN
INTRAMUSCULAR | Status: DC | PRN
  Administered 2024-06-02: 4 mg via INTRAVENOUS

## 2024-06-02 MED ORDER — ACETAMINOPHEN 10 MG/ML IV SOLN
1000.0000 mg | Freq: Once | INTRAVENOUS | Status: AC
  Administered 2024-06-02: 1000 mg via INTRAVENOUS

## 2024-06-02 MED ORDER — SODIUM CHLORIDE 0.9 % IV SOLN: INTRAVENOUS | Status: DC

## 2024-06-02 MED ORDER — MIDAZOLAM HCL 2 MG/2ML IJ SOLN
INTRAMUSCULAR | Status: AC
  Filled 2024-06-02: qty 2

## 2024-06-02 MED ORDER — MIDAZOLAM HCL 2 MG/2ML IJ SOLN
INTRAMUSCULAR | Status: DC | PRN
  Administered 2024-06-02: 1 mg via INTRAVENOUS

## 2024-06-02 MED ORDER — PREDNISONE 10 MG PO TABS: ORAL_TABLET | ORAL | 0 refills | Status: AC

## 2024-06-02 MED ORDER — LIDOCAINE-EPINEPHRINE 1 %-1:100000 IJ SOLN
INTRAMUSCULAR | Status: DC | PRN
  Administered 2024-06-02: 2.5 mL

## 2024-06-02 MED ORDER — EPHEDRINE SULFATE (PRESSORS) 50 MG/ML IJ SOLN
INTRAMUSCULAR | Status: DC | PRN
  Administered 2024-06-02 (×2): 7.5 mg via INTRAVENOUS

## 2024-06-02 MED ORDER — PROPOFOL 10 MG/ML IV BOLUS
INTRAVENOUS | Status: DC | PRN
Start: 2024-06-02 — End: 2024-06-02
  Administered 2024-06-02: 200 mg via INTRAVENOUS

## 2024-06-02 MED ORDER — LIDOCAINE HCL (CARDIAC) PF 100 MG/5ML IV SOSY
PREFILLED_SYRINGE | INTRAVENOUS | Status: DC | PRN
  Administered 2024-06-02: 60 mg via INTRAVENOUS

## 2024-06-02 MED ORDER — SODIUM CHLORIDE 0.9 % IV SOLN: INTRAVENOUS | Status: DC | PRN

## 2024-06-02 MED ORDER — PHENYLEPHRINE HCL (PRESSORS) 10 MG/ML IV SOLN
INTRAVENOUS | Status: DC | PRN
  Administered 2024-06-02: 40 ug via INTRAVENOUS

## 2024-06-02 MED ORDER — ACETAMINOPHEN 10 MG/ML IV SOLN
INTRAVENOUS | Status: AC
  Filled 2024-06-02: qty 100

## 2024-06-02 MED ORDER — PROPOFOL 10 MG/ML IV BOLUS
INTRAVENOUS | Status: AC
Start: 2024-06-02 — End: 2024-06-02
  Filled 2024-06-02: qty 20

## 2024-06-02 MED ORDER — LACTATED RINGERS IV SOLN: INTRAVENOUS | Status: DC

## 2024-06-02 MED ORDER — EPHEDRINE 5 MG/ML INJ
INTRAVENOUS | Status: AC
Start: 2024-06-02 — End: 2024-06-02
  Filled 2024-06-02: qty 5

## 2024-06-02 MED ORDER — PHENYLEPHRINE HCL 0.5 % NA SOLN
NASAL | Status: DC | PRN
  Administered 2024-06-02: 5 mL via NASAL

## 2024-06-02 MED ORDER — FENTANYL CITRATE (PF) 100 MCG/2ML IJ SOLN
INTRAMUSCULAR | Status: AC
  Filled 2024-06-02: qty 2

## 2024-06-02 MED ORDER — DEXAMETHASONE SODIUM PHOSPHATE 4 MG/ML IJ SOLN
INTRAMUSCULAR | Status: DC | PRN
Start: 2024-06-02 — End: 2024-06-02
  Administered 2024-06-02: 8 mg via INTRAVENOUS

## 2024-06-02 MED ORDER — FENTANYL CITRATE (PF) 100 MCG/2ML IJ SOLN
INTRAMUSCULAR | Status: DC | PRN
  Administered 2024-06-02: 100 ug via INTRAVENOUS

## 2024-06-02 MED ORDER — ONDANSETRON HCL 4 MG/2ML IJ SOLN
INTRAMUSCULAR | Status: AC
  Filled 2024-06-02: qty 2

## 2024-06-02 MED ORDER — DEXAMETHASONE SODIUM PHOSPHATE 4 MG/ML IJ SOLN
INTRAMUSCULAR | Status: AC
  Filled 2024-06-02: qty 2

## 2024-06-02 MED ORDER — HYDROCODONE-ACETAMINOPHEN 5-325 MG PO TABS: 1.0000 | ORAL_TABLET | Freq: Four times a day (QID) | ORAL | 0 refills | Status: AC | PRN

## 2024-06-02 MED ORDER — LIDOCAINE HCL (PF) 2 % IJ SOLN
INTRAMUSCULAR | Status: AC
  Filled 2024-06-02: qty 5

## 2024-06-02 SURGICAL SUPPLY — 27 items
BLADE SHAVER TRUDI STR 4 (ENT DISPOSABLE) ×2 IMPLANT
CABLE TRUDI DISPOSABLE (ENT DISPOSABLE) ×4 IMPLANT
CANISTER SUCT 1200ML W/VALVE (MISCELLANEOUS) ×2 IMPLANT
CATH IV 18X1 1/4 SAFELET (CATHETERS) ×2 IMPLANT
COAGULATOR SUCT 8FR VV (MISCELLANEOUS) ×2 IMPLANT
DEVICE INFLATION SEID (MISCELLANEOUS) IMPLANT
ELECTRODE REM PT RTRN 9FT ADLT (ELECTROSURGICAL) ×2 IMPLANT
GLOVE SURG GAMMEX PI TX LF 7.5 (GLOVE) ×4 IMPLANT
GOWN STRL REUS W/ TWL LRG LVL3 (GOWN DISPOSABLE) ×2 IMPLANT
IV NS 500ML BAXH (IV SOLUTION) ×2 IMPLANT
KIT TURNOVER KIT A (KITS) ×2 IMPLANT
NDL ANESTHESIA 27G X 3.5 (NEEDLE) IMPLANT
NDL HYPO 27GX1-1/4 (NEEDLE) ×2 IMPLANT
NEEDLE ANESTHESIA 27G X 3.5 (NEEDLE) ×2 IMPLANT
NEEDLE HYPO 27GX1-1/4 (NEEDLE) ×2 IMPLANT
NS IRRIG 500ML POUR BTL (IV SOLUTION) ×2 IMPLANT
PACK ENT CUSTOM (PACKS) ×2 IMPLANT
PACKING NASAL EPIS 4X2.4 XEROG (MISCELLANEOUS) ×4 IMPLANT
PATTIES SURGICAL .5 X3 (DISPOSABLE) ×2 IMPLANT
SOLUTION ANTFG W/FOAM PAD STRL (MISCELLANEOUS) ×2 IMPLANT
STRAP BODY AND KNEE 60X3 (MISCELLANEOUS) ×2 IMPLANT
SYR 3ML LL SCALE MARK (SYRINGE) ×2 IMPLANT
SYSTEM BALLOON SINUPLASTY 6X16 (SINUPLASTY) IMPLANT
TOWEL OR 17X26 4PK STRL BLUE (TOWEL DISPOSABLE) ×2 IMPLANT
TRACKER DISPOSABLE PAITIENT (MISCELLANEOUS) ×2 IMPLANT
TUBING IRRIGATION BIEN-AIR (TUBING) ×2 IMPLANT
WATER STERILE IRR 250ML POUR (IV SOLUTION) ×2 IMPLANT

## 2024-06-02 NOTE — Op Note (Signed)
 06/02/2024  9:36 AM    Lynn Gomez  [id number]   Pre-Op Dx: Chronic right maxillary sinusitis unresponsive to medical treatment  Post-op Dx: Same  Proc: Right endoscopic maxillary antrostomy with removal of retained uncinate process and scar tissue at the antral opening  Surg:  Deward DEL [place] Nunley  Anes:  GOT  EBL: 50 mL  Comp: None  Findings: Previous right maxillary antrostomy went through the uncinate process but did not bridge the opening to the original ostium.  This created recycling and mucus collecting in the anterior superior right maxillary sinus  Procedure: The patient was brought to the operating room and placed in a supine position.  She was given general anesthesia by oral endotracheal intubation.  Once she was asleep the nose was prepped using cottonoid pledgets soaked in phenylephrine  and Xylocaine .  These were allowed to sit for 5 minutes while she was prepped and draped in a sterile fashion.  The image guided system was brought in and the system was registered to the face by tracing landmarks over the forehead temples and nose.  The cottonoids were removed and 0 degree scope was used to visualize the nose and both sides.  The septum was straight and the airways were open.  The image guided system was tested and showed fairly good alignment.  You could see what looked like an accessory ostium on the right side however this had a scar band through the middle of that and apparently was a previous right vaginal antrostomy that went through the uncinate process and inferior to it rather than removing the entire uncinate process.  There was mucus coming out the current hole and also some coming out from behind the uncinate process showing the recycling problem.  Middle turbinate was infractured to visualize the middle meatus better.  1% Xylocaine  with epi 1: 100,000 was used for infiltration in the uncinate process, the anterior root of the middle turbinate, and the posterior  root of the middle turbinate.  A left sided backbiting forcep was used for incising and removing most of the uncinate process.  A 45 degree through biting forcep was used to remove the rest of it.  A microdebrider was then used to smooth up the edges along where the uncinate process was removed.  Straight through body forcep was used to open the uncinate process more posteriorly and smoothed this out.  There was thickened tissue between the previous antral opening that was made and the natural ostium.  Using a 45 degree through biting forceps this tissue was all removed to create a smooth opening from the superior sinus along to the antral opening.  All the soft tissue and scarring was cleaned out in this area to create a wide open antral window.  A 30 degree scope was used in this area to visualize this to make sure the edges were clean and the sinus was completely clear.  Scope was used visualize the rest of the sinus and there was no other disease noted.  Xerogel was then placed into the superior anterior portion of the Antral window.  This was folded on itself and used to help hold the middle turbinate medially.  It was wetted with some water.  Airway was wide open and clear on both sides.  Patient tolerated the procedure well.  She was awakened and taken to the recovery room in satisfactory condition.  There were no operative complications.   Dispo:   To PACU to be watched while she wakes up  and then to be discharged home  Plan: Follow-up in the office in 1 week to make sure she is doing well and the sinus is open.  She will rest at home for the next 4 days and take it easy.  She can start some saline flushes a couple times a day beginning tomorrow.  She has some Tylenol  with hydrocodone  for pain.  She will use a prednisone  taper for the next 6 days.  She does not need any antibiotics.  Deward DEL Budd Freiermuth  06/02/2024 9:36 AM

## 2024-06-02 NOTE — H&P (Signed)
H&P has been reviewed and patient reevaluated, no changes necessary. To be downloaded later.  

## 2024-06-02 NOTE — Anesthesia Procedure Notes (Addendum)
 Procedure Name: Intubation Date/Time: 06/02/2024 8:50 AM  Performed by: Athziry Millican, [place], CRNAPre-anesthesia Checklist: Patient identified, Emergency Drugs available, Suction available and Patient being monitored Patient Re-evaluated:Patient Re-evaluated prior to induction Oxygen Delivery Method: Circle system utilized Preoxygenation: Pre-oxygenation with 100% oxygen Induction Type: IV induction Ventilation: Mask ventilation without difficulty Laryngoscope Size: Mac and 3 Grade View: Grade III Tube type: Oral Rae Tube size: 7.0 mm Number of attempts: 1 Airway Equipment and Method: Stylet and Oral airway Placement Confirmation: ETT inserted through vocal cords under direct vision, positive ETCO2 and breath sounds checked- equal and bilateral Secured at: 22 cm Tube secured with: Tape Dental Injury: Teeth and Oropharynx as per pre-operative assessment

## 2024-06-02 NOTE — Anesthesia Postprocedure Evaluation (Signed)
 Anesthesia Post Note  Patient: QIARA MINETTI  Procedure(s) Performed: SINUS SURGERY, WITH IMAGING GUIDANCE (Bilateral) MAXILLARY ANTROSTOMY (Right)  Patient location during evaluation: PACU Anesthesia Type: General Level of consciousness: awake and alert Pain management: pain level controlled Vital Signs Assessment: post-procedure vital signs reviewed and stable Respiratory status: spontaneous breathing, nonlabored ventilation, respiratory function stable and patient connected to nasal cannula oxygen Cardiovascular status: blood pressure returned to baseline and stable Postop Assessment: no apparent nausea or vomiting Anesthetic complications: no   No notable events documented.   Last Vitals:  Vitals:   06/02/24 0946 06/02/24 1010  BP: (!) 117/54 (P) 128/61  Pulse: 70   Resp: 15   Temp: 36.6 C (P) 36.7 C  SpO2: 99%     Last Pain:  Vitals:   06/02/24 1010  TempSrc:   PainSc: (P) 0-No pain                 Karas Pickerill C Noboru Bidinger

## 2024-06-02 NOTE — Transfer of Care (Signed)
 Immediate Anesthesia Transfer of Care Note  Patient: Lynn Gomez  Procedure(s) Performed: SINUS SURGERY, WITH IMAGING GUIDANCE (Bilateral) MAXILLARY ANTROSTOMY (Right)  Patient Location: PACU  Anesthesia Type: No value filed.  Level of Consciousness: awake, alert  and patient cooperative  Airway and Oxygen Therapy: Patient Spontanous Breathing and Patient connected to supplemental oxygen  Post-op Assessment: Post-op Vital signs reviewed, Patient's Cardiovascular Status Stable, Respiratory Function Stable, Patent Airway and No signs of Nausea or vomiting  Post-op Vital Signs: Reviewed and stable  Complications: No notable events documented.

## 2024-06-03 LAB — SURGICAL PATHOLOGY

## 2024-06-14 ENCOUNTER — Other Ambulatory Visit: Payer: Self-pay | Admitting: Internal Medicine

## 2024-06-14 DIAGNOSIS — Z1231 Encounter for screening mammogram for malignant neoplasm of breast: Secondary | ICD-10-CM

## 2024-07-06 ENCOUNTER — Ambulatory Visit
Admission: RE | Admit: 2024-07-06 | Discharge: 2024-07-06 | Disposition: A | Discharge status: Home / Self Care | Source: Ambulatory Visit | Attending: Internal Medicine | Admitting: Internal Medicine

## 2024-07-06 DIAGNOSIS — Z1231 Encounter for screening mammogram for malignant neoplasm of breast: Secondary | ICD-10-CM | POA: Diagnosis present
